# Patient Record
Sex: Female | Born: 1940 | Race: Black or African American | Hispanic: No | Smoking: Never smoker
Health system: Southern US, Community
[De-identification: ages and names within clinical notes are randomized; demographics above are authoritative.]

## PROBLEM LIST (undated history)

## (undated) DIAGNOSIS — K589 Irritable bowel syndrome without diarrhea: Secondary | ICD-10-CM

## (undated) DIAGNOSIS — K635 Polyp of colon: Secondary | ICD-10-CM

## (undated) DIAGNOSIS — D649 Anemia, unspecified: Secondary | ICD-10-CM

## (undated) DIAGNOSIS — M1711 Unilateral primary osteoarthritis, right knee: Secondary | ICD-10-CM

## (undated) DIAGNOSIS — K5792 Diverticulitis of intestine, part unspecified, without perforation or abscess without bleeding: Secondary | ICD-10-CM

## (undated) DIAGNOSIS — K219 Gastro-esophageal reflux disease without esophagitis: Secondary | ICD-10-CM

## (undated) DIAGNOSIS — D721 Eosinophilia, unspecified: Secondary | ICD-10-CM

## (undated) DIAGNOSIS — M1990 Unspecified osteoarthritis, unspecified site: Secondary | ICD-10-CM

## (undated) DIAGNOSIS — K9049 Malabsorption due to intolerance, not elsewhere classified: Secondary | ICD-10-CM

## (undated) DIAGNOSIS — I10 Essential (primary) hypertension: Secondary | ICD-10-CM

## (undated) DIAGNOSIS — K5790 Diverticulosis of intestine, part unspecified, without perforation or abscess without bleeding: Secondary | ICD-10-CM

## (undated) DIAGNOSIS — Z79899 Other long term (current) drug therapy: Secondary | ICD-10-CM

## (undated) DIAGNOSIS — E782 Mixed hyperlipidemia: Secondary | ICD-10-CM

## (undated) DIAGNOSIS — E669 Obesity, unspecified: Secondary | ICD-10-CM

## (undated) DIAGNOSIS — M5030 Other cervical disc degeneration, unspecified cervical region: Secondary | ICD-10-CM

## (undated) HISTORY — PX: COLONOSCOPY: SHX174

## (undated) HISTORY — PX: JOINT REPLACEMENT: SHX530

## (undated) HISTORY — PX: COLON SURGERY: SHX602

## (undated) HISTORY — PX: POLYPECTOMY: SHX149

## (undated) HISTORY — PX: ESOPHAGOGASTRODUODENOSCOPY: SHX1529

## (undated) HISTORY — PX: BILATERAL CARPAL TUNNEL RELEASE: SHX6508

---

## 1973-08-31 HISTORY — PX: ORIF DISTAL RADIUS FRACTURE: SUR927

## 1974-08-08 HISTORY — PX: HARDWARE REMOVAL: SHX979

## 1990-03-02 HISTORY — PX: TOTAL HIP ARTHROPLASTY: SHX124

## 1990-03-02 HISTORY — PX: HEMICOLECTOMY: SHX854

## 1995-03-03 HISTORY — PX: REVISION TOTAL HIP ARTHROPLASTY: SHX766

## 2001-03-02 HISTORY — PX: TOTAL HIP ARTHROPLASTY: SHX124

## 2004-01-01 ENCOUNTER — Ambulatory Visit: Payer: Self-pay | Admitting: Urology

## 2004-11-13 ENCOUNTER — Ambulatory Visit: Payer: Self-pay | Admitting: Internal Medicine

## 2005-09-24 ENCOUNTER — Other Ambulatory Visit: Payer: Self-pay

## 2005-09-24 ENCOUNTER — Emergency Department: Payer: Self-pay | Admitting: Emergency Medicine

## 2005-11-26 ENCOUNTER — Ambulatory Visit: Payer: Self-pay | Admitting: Internal Medicine

## 2006-04-15 ENCOUNTER — Ambulatory Visit: Payer: Self-pay | Admitting: Unknown Physician Specialty

## 2006-06-01 ENCOUNTER — Other Ambulatory Visit: Payer: Self-pay

## 2006-06-01 ENCOUNTER — Ambulatory Visit: Payer: Self-pay | Admitting: Unknown Physician Specialty

## 2006-06-04 ENCOUNTER — Ambulatory Visit: Payer: Self-pay | Admitting: Unknown Physician Specialty

## 2006-11-29 ENCOUNTER — Ambulatory Visit: Payer: Self-pay | Admitting: Internal Medicine

## 2007-03-03 HISTORY — PX: UPPER GI ENDOSCOPY: SHX6162

## 2007-05-23 ENCOUNTER — Ambulatory Visit: Payer: Self-pay | Admitting: Gastroenterology

## 2007-05-23 HISTORY — PX: ESOPHAGOGASTRODUODENOSCOPY: SHX1529

## 2007-08-04 ENCOUNTER — Ambulatory Visit: Payer: Self-pay | Admitting: Unknown Physician Specialty

## 2007-08-04 ENCOUNTER — Other Ambulatory Visit: Payer: Self-pay

## 2007-08-15 ENCOUNTER — Ambulatory Visit: Payer: Self-pay | Admitting: Unknown Physician Specialty

## 2007-12-13 ENCOUNTER — Ambulatory Visit: Payer: Self-pay | Admitting: Internal Medicine

## 2008-08-06 ENCOUNTER — Ambulatory Visit: Payer: Self-pay | Admitting: Unknown Physician Specialty

## 2009-01-23 ENCOUNTER — Ambulatory Visit: Payer: Self-pay | Admitting: Internal Medicine

## 2010-01-30 ENCOUNTER — Ambulatory Visit: Payer: Self-pay | Admitting: Internal Medicine

## 2010-03-08 ENCOUNTER — Ambulatory Visit: Payer: Self-pay | Admitting: Internal Medicine

## 2010-07-17 ENCOUNTER — Ambulatory Visit: Payer: Self-pay | Admitting: Internal Medicine

## 2011-02-02 ENCOUNTER — Ambulatory Visit: Payer: Self-pay | Admitting: Internal Medicine

## 2011-03-21 ENCOUNTER — Ambulatory Visit: Payer: Self-pay

## 2011-05-21 ENCOUNTER — Ambulatory Visit: Payer: Self-pay | Admitting: Internal Medicine

## 2012-02-03 ENCOUNTER — Ambulatory Visit: Payer: Self-pay | Admitting: Internal Medicine

## 2012-08-13 ENCOUNTER — Ambulatory Visit: Payer: Self-pay | Admitting: Family Medicine

## 2012-08-13 LAB — SEDIMENTATION RATE: Erythrocyte Sed Rate: 4 mm/h

## 2012-08-13 LAB — COMPREHENSIVE METABOLIC PANEL WITH GFR
Albumin: 4.1 g/dL
Alkaline Phosphatase: 95 U/L
Anion Gap: 6 — ABNORMAL LOW
BUN: 8 mg/dL
Bilirubin,Total: 0.8 mg/dL
Calcium, Total: 9.3 mg/dL
Chloride: 104 mmol/L
Co2: 32 mmol/L
Creatinine: 0.63 mg/dL
EGFR (African American): >60
EGFR (Non-African Amer.): >60
Glucose: 87 mg/dL
Osmolality: 281
Potassium: 3.4 mmol/L — ABNORMAL LOW
SGOT(AST): 20 U/L
SGPT (ALT): 27 U/L
Sodium: 142 mmol/L
Total Protein: 7.7 g/dL

## 2012-08-13 LAB — CBC WITH DIFFERENTIAL/PLATELET
Basophil #: 0.1 10*3/uL (ref 0.0–0.1)
Basophil %: 1.1 %
HGB: 12.6 g/dL (ref 12.0–16.0)
Lymphocyte #: 2.7 10*3/uL (ref 1.0–3.6)
Lymphocyte %: 36.7 %
MCH: 22.8 pg — ABNORMAL LOW (ref 26.0–34.0)
Monocyte %: 9.1 %
RDW: 15 % — ABNORMAL HIGH (ref 11.5–14.5)
WBC: 7.3 10*3/uL (ref 3.6–11.0)

## 2012-09-01 ENCOUNTER — Ambulatory Visit: Payer: Self-pay | Admitting: Internal Medicine

## 2013-02-07 ENCOUNTER — Ambulatory Visit: Payer: Self-pay | Admitting: Internal Medicine

## 2013-09-12 ENCOUNTER — Ambulatory Visit: Payer: Self-pay | Admitting: Gastroenterology

## 2013-12-31 DIAGNOSIS — M5030 Other cervical disc degeneration, unspecified cervical region: Secondary | ICD-10-CM

## 2013-12-31 HISTORY — DX: Other cervical disc degeneration, unspecified cervical region: M50.30

## 2014-01-01 DIAGNOSIS — M5030 Other cervical disc degeneration, unspecified cervical region: Secondary | ICD-10-CM | POA: Insufficient documentation

## 2014-01-01 DIAGNOSIS — K219 Gastro-esophageal reflux disease without esophagitis: Secondary | ICD-10-CM | POA: Insufficient documentation

## 2014-01-01 DIAGNOSIS — I10 Essential (primary) hypertension: Secondary | ICD-10-CM | POA: Insufficient documentation

## 2014-01-01 DIAGNOSIS — R898 Other abnormal findings in specimens from other organs, systems and tissues: Secondary | ICD-10-CM | POA: Insufficient documentation

## 2014-01-01 DIAGNOSIS — E782 Mixed hyperlipidemia: Secondary | ICD-10-CM | POA: Insufficient documentation

## 2014-01-01 DIAGNOSIS — J302 Other seasonal allergic rhinitis: Secondary | ICD-10-CM

## 2014-01-01 DIAGNOSIS — D721 Eosinophilia: Secondary | ICD-10-CM

## 2014-01-01 DIAGNOSIS — M1990 Unspecified osteoarthritis, unspecified site: Secondary | ICD-10-CM

## 2014-01-01 HISTORY — DX: Gastro-esophageal reflux disease without esophagitis: K21.9

## 2014-01-01 HISTORY — DX: Other seasonal allergic rhinitis: J30.2

## 2014-01-01 HISTORY — DX: Unspecified osteoarthritis, unspecified site: M19.90

## 2014-02-08 ENCOUNTER — Ambulatory Visit: Payer: Self-pay | Admitting: Internal Medicine

## 2014-07-02 DIAGNOSIS — I10 Essential (primary) hypertension: Secondary | ICD-10-CM | POA: Diagnosis not present

## 2014-07-02 DIAGNOSIS — E782 Mixed hyperlipidemia: Secondary | ICD-10-CM | POA: Diagnosis not present

## 2014-07-02 DIAGNOSIS — D721 Eosinophilia: Secondary | ICD-10-CM | POA: Diagnosis not present

## 2014-07-02 DIAGNOSIS — K219 Gastro-esophageal reflux disease without esophagitis: Secondary | ICD-10-CM | POA: Diagnosis not present

## 2014-08-15 DIAGNOSIS — H5210 Myopia, unspecified eye: Secondary | ICD-10-CM | POA: Diagnosis not present

## 2014-08-15 DIAGNOSIS — H5203 Hypermetropia, bilateral: Secondary | ICD-10-CM | POA: Diagnosis not present

## 2014-08-28 DIAGNOSIS — H2513 Age-related nuclear cataract, bilateral: Secondary | ICD-10-CM | POA: Diagnosis not present

## 2014-10-05 DIAGNOSIS — M47816 Spondylosis without myelopathy or radiculopathy, lumbar region: Secondary | ICD-10-CM | POA: Diagnosis not present

## 2014-10-05 DIAGNOSIS — M5416 Radiculopathy, lumbar region: Secondary | ICD-10-CM | POA: Diagnosis not present

## 2014-10-05 DIAGNOSIS — I10 Essential (primary) hypertension: Secondary | ICD-10-CM | POA: Diagnosis not present

## 2014-10-05 DIAGNOSIS — M4316 Spondylolisthesis, lumbar region: Secondary | ICD-10-CM | POA: Diagnosis not present

## 2015-01-02 DIAGNOSIS — D721 Eosinophilia: Secondary | ICD-10-CM | POA: Diagnosis not present

## 2015-01-02 DIAGNOSIS — K219 Gastro-esophageal reflux disease without esophagitis: Secondary | ICD-10-CM | POA: Diagnosis not present

## 2015-01-02 DIAGNOSIS — Z79899 Other long term (current) drug therapy: Secondary | ICD-10-CM | POA: Diagnosis not present

## 2015-01-02 DIAGNOSIS — I10 Essential (primary) hypertension: Secondary | ICD-10-CM | POA: Diagnosis not present

## 2015-01-02 DIAGNOSIS — H9313 Tinnitus, bilateral: Secondary | ICD-10-CM | POA: Insufficient documentation

## 2015-01-02 DIAGNOSIS — Z23 Encounter for immunization: Secondary | ICD-10-CM | POA: Diagnosis not present

## 2015-01-02 DIAGNOSIS — E782 Mixed hyperlipidemia: Secondary | ICD-10-CM | POA: Diagnosis not present

## 2015-01-02 DIAGNOSIS — J302 Other seasonal allergic rhinitis: Secondary | ICD-10-CM | POA: Diagnosis not present

## 2015-06-19 DIAGNOSIS — J301 Allergic rhinitis due to pollen: Secondary | ICD-10-CM | POA: Diagnosis not present

## 2015-06-19 DIAGNOSIS — R0981 Nasal congestion: Secondary | ICD-10-CM | POA: Diagnosis not present

## 2015-06-25 DIAGNOSIS — J301 Allergic rhinitis due to pollen: Secondary | ICD-10-CM | POA: Diagnosis not present

## 2015-07-04 DIAGNOSIS — Z780 Asymptomatic menopausal state: Secondary | ICD-10-CM | POA: Diagnosis not present

## 2015-07-04 DIAGNOSIS — K589 Irritable bowel syndrome without diarrhea: Secondary | ICD-10-CM | POA: Diagnosis not present

## 2015-07-04 DIAGNOSIS — D721 Eosinophilia: Secondary | ICD-10-CM | POA: Diagnosis not present

## 2015-07-04 DIAGNOSIS — I10 Essential (primary) hypertension: Secondary | ICD-10-CM | POA: Diagnosis not present

## 2015-07-04 DIAGNOSIS — E782 Mixed hyperlipidemia: Secondary | ICD-10-CM | POA: Diagnosis not present

## 2015-07-04 DIAGNOSIS — H9313 Tinnitus, bilateral: Secondary | ICD-10-CM | POA: Diagnosis not present

## 2015-07-04 DIAGNOSIS — K219 Gastro-esophageal reflux disease without esophagitis: Secondary | ICD-10-CM | POA: Diagnosis not present

## 2015-07-04 DIAGNOSIS — J302 Other seasonal allergic rhinitis: Secondary | ICD-10-CM | POA: Diagnosis not present

## 2015-07-12 DIAGNOSIS — Z780 Asymptomatic menopausal state: Secondary | ICD-10-CM | POA: Diagnosis not present

## 2015-07-24 DIAGNOSIS — R1032 Left lower quadrant pain: Secondary | ICD-10-CM | POA: Diagnosis not present

## 2015-07-24 DIAGNOSIS — R509 Fever, unspecified: Secondary | ICD-10-CM | POA: Diagnosis not present

## 2015-07-24 DIAGNOSIS — R1031 Right lower quadrant pain: Secondary | ICD-10-CM | POA: Diagnosis not present

## 2015-07-24 DIAGNOSIS — R350 Frequency of micturition: Secondary | ICD-10-CM | POA: Diagnosis not present

## 2015-08-21 DIAGNOSIS — N760 Acute vaginitis: Secondary | ICD-10-CM | POA: Diagnosis not present

## 2015-08-21 DIAGNOSIS — R300 Dysuria: Secondary | ICD-10-CM | POA: Diagnosis not present

## 2015-08-28 DIAGNOSIS — H1045 Other chronic allergic conjunctivitis: Secondary | ICD-10-CM | POA: Diagnosis not present

## 2015-08-28 DIAGNOSIS — H2513 Age-related nuclear cataract, bilateral: Secondary | ICD-10-CM | POA: Diagnosis not present

## 2015-09-17 DIAGNOSIS — M654 Radial styloid tenosynovitis [de Quervain]: Secondary | ICD-10-CM | POA: Diagnosis not present

## 2015-09-18 DIAGNOSIS — M654 Radial styloid tenosynovitis [de Quervain]: Secondary | ICD-10-CM | POA: Diagnosis not present

## 2015-12-17 DIAGNOSIS — M654 Radial styloid tenosynovitis [de Quervain]: Secondary | ICD-10-CM | POA: Diagnosis not present

## 2015-12-20 ENCOUNTER — Emergency Department: Payer: Commercial Managed Care - HMO

## 2015-12-20 ENCOUNTER — Emergency Department
Admission: EM | Admit: 2015-12-20 | Discharge: 2015-12-20 | Disposition: A | Discharge status: Home / Self Care | Payer: Commercial Managed Care - HMO | Attending: Emergency Medicine | Admitting: Emergency Medicine

## 2015-12-20 ENCOUNTER — Encounter: Payer: Self-pay | Admitting: Emergency Medicine

## 2015-12-20 DIAGNOSIS — R0789 Other chest pain: Secondary | ICD-10-CM | POA: Diagnosis not present

## 2015-12-20 DIAGNOSIS — I10 Essential (primary) hypertension: Secondary | ICD-10-CM | POA: Insufficient documentation

## 2015-12-20 DIAGNOSIS — K219 Gastro-esophageal reflux disease without esophagitis: Secondary | ICD-10-CM

## 2015-12-20 DIAGNOSIS — R079 Chest pain, unspecified: Secondary | ICD-10-CM | POA: Diagnosis not present

## 2015-12-20 HISTORY — DX: Essential (primary) hypertension: I10

## 2015-12-20 LAB — CBC
HCT: 41.1 % (ref 35.0–47.0)
Hemoglobin: 13.1 g/dL (ref 12.0–16.0)
MCH: 23.4 pg — AB (ref 26.0–34.0)
MCHC: 31.7 g/dL — AB (ref 32.0–36.0)
MCV: 73.8 fL — ABNORMAL LOW (ref 80.0–100.0)
PLATELETS: 179 10*3/uL (ref 150–440)
RBC: 5.57 MIL/uL — ABNORMAL HIGH (ref 3.80–5.20)
RDW: 15.1 % — AB (ref 11.5–14.5)
WBC: 6.8 10*3/uL (ref 3.6–11.0)

## 2015-12-20 LAB — BASIC METABOLIC PANEL
Anion gap: 10 (ref 5–15)
BUN: 8 mg/dL (ref 6–20)
CALCIUM: 9.6 mg/dL (ref 8.9–10.3)
CO2: 27 mmol/L (ref 22–32)
CREATININE: 0.64 mg/dL (ref 0.44–1.00)
Chloride: 103 mmol/L (ref 101–111)
GFR calc non Af Amer: >60 mL/min (ref >60)
GLUCOSE: 114 mg/dL — AB (ref 65–99)
Potassium: 3.4 mmol/L — ABNORMAL LOW (ref 3.5–5.1)
Sodium: 140 mmol/L (ref 135–145)

## 2015-12-20 LAB — TROPONIN I

## 2015-12-20 NOTE — ED Provider Notes (Signed)
[facility] Emergency Department Provider Note ____________________________________________   I have reviewed the triage vital signs and the triage nursing note.  HISTORY  Chief Complaint Anxiety and Chest Pain   Historian Patient  HPI Roberta Hanson is a 75 y.o. female here with her daughter, for evaluation of epigastric discomfort forabout 2 days now. She states that her symptoms started after she started taking meloxicam which was prescribed for right wrist pain that she thinks was due to overuse as she has a history of couple tunnel. She came in because of the discomfort in her epigastrium and lower chest where she felt like she was having gas rumbling. She's not sure if her heart was skipping beats, but she felt movement in the lower part of her chest near her stomach which she thought was probably gas pains.  No nausea or sweats. No exertional discomfort. No known history of cardiac disease. No coughing or trouble breathing. Symptoms are most resolved right now. She reports having a stress test in the past, but many years ago at this point.    Past Medical History:  Diagnosis Date  . Hypertension     There are no active problems to display for this patient.   History reviewed. No pertinent surgical history.  Prior to Admission medications   Not on File    No Known Allergies  History reviewed. No pertinent family history.  Social History Social History  Substance Use Topics  . Smoking status: Never Smoker  . Smokeless tobacco: Never Used  . Alcohol use No    Review of Systems  Constitutional: Negative for fever. Eyes: Negative for visual changes. ENT: Negative for sore throat. Cardiovascular: Negative for Pleuritic chest pain. Respiratory: Negative for shortness of breath. Gastrointestinal: Negative for vomiting and diarrhea. Genitourinary: Negative for dysuria. Musculoskeletal: Negative for back pain. Skin: Negative for  rash. Neurological: Negative for headache. 10 point Review of Systems otherwise negative ____________________________________________   PHYSICAL EXAM:  VITAL SIGNS: ED Triage Vitals  Enc Vitals Group     BP 12/20/15 0919 (!) 151/64     Pulse Rate 12/20/15 0919 75     Resp 12/20/15 0919 18     Temp 12/20/15 0919 97.8 F (36.6 C)     Temp Source 12/20/15 0919 Oral     SpO2 12/20/15 0919 100 %     Weight 12/20/15 0915 200 lb (90.7 kg)     Height 12/20/15 0915 5\' 3"  (1.6 m)     Head Circumference --      Peak Flow --      Pain Score 12/20/15 0915 7     Pain Loc --      Pain Edu? --      Excl. in [place]? --      Constitutional: Alert and oriented. Well appearing and in no distress. HEENT   Head: Normocephalic and atraumatic.      Eyes: Conjunctivae are normal. PERRL. Normal extraocular movements.      Ears:         Nose: No congestion/rhinnorhea.   Mouth/Throat: Mucous membranes are moist.   Neck: No stridor. Cardiovascular/Chest: Normal rate, regular rhythm.  No murmurs, rubs, or gallops. Respiratory: Normal respiratory effort without tachypnea nor retractions. Breath sounds are clear and equal bilaterally. No wheezes/rales/rhonchi. Gastrointestinal: Soft. No distention, no guarding, no rebound. Nontender.  Obese  Genitourinary/rectal:Deferred Musculoskeletal: Nontender with normal range of motion in all extremities. No joint effusions.  No lower extremity tenderness.  No edema.  Patient reporting some discomfort at the right wrist, no obvious effusion or deformity, redness or skin rash. Neurologic:  Normal speech and language. No gross or focal neurologic deficits are appreciated. Skin:  Skin is warm, dry and intact. No rash noted. Psychiatric: Mood and affect are normal. Speech and behavior are normal. Patient exhibits appropriate insight and judgment.   ____________________________________________  LABS (pertinent positives/negatives)  Labs Reviewed  BASIC  METABOLIC PANEL - Abnormal; Notable for the following:       Result Value   Potassium 3.4 (*)    Glucose, Bld 114 (*)    All other components within normal limits  CBC - Abnormal; Notable for the following:    RBC 5.57 (*)    MCV 73.8 (*)    MCH 23.4 (*)    MCHC 31.7 (*)    RDW 15.1 (*)    All other components within normal limits  TROPONIN I    ____________________________________________    EKG I, Lisa Roca, MD, the attending physician have personally viewed and interpreted all ECGs.  76 bpm. Normal sinus rhythm. Left bundle branch block. Left axis deviation. Next  Left bundle branch is new compared to prior EKGs ____________________________________________  RADIOLOGY All Xrays were viewed by me. Imaging interpreted by Radiologist.  Chest x-ray two-view:  IMPRESSION: No active cardiopulmonary disease.  No evidence of pneumonia. __________________________________________  PROCEDURES  Procedure(s) performed: None  Critical Care performed: None  ____________________________________________   ED COURSE / ASSESSMENT AND PLAN  Pertinent labs & imaging results that were available during my care of the patient were reviewed by me and considered in my medical decision making (see chart for details).  Ms. Strande came here due to the lower chest/epigastric discomfort that clinically sounds like it is related to indigestion potentially made worse after she started meloxicam for her wrist. Symptoms do not sound likely to be due to ACS, and she is having no symptoms now. Her EKG does show new left bundle-branch block, but she is not having symptoms of an active heart attack. Her symptoms were ongoing over 24 hours, and her troponin is negative.  I have asked her to go ahead and follow up with a cardiologist given the new EKG findings, and she states she will probably need a referral from her primary care doctor to see a specialist.  I have asked her to directly call the  cardiologist office to ask for help in close follow up.  In terms of her inflammation of her wrist since she can't really take meloxicam, we did discuss trying an elimination diet for anti-inflammatory effect.  We discussed return precautions at length, and she feels comfortable discharging home today.  CONSULTATIONS:  None   Patient / Family / Caregiver informed of clinical course, medical decision-making process, and agree with plan.   I discussed return precautions, follow-up instructions, and discharge instructions with patient and/or family.   ___________________________________________   FINAL CLINICAL IMPRESSION(S) / ED DIAGNOSES   Final diagnoses:  Atypical chest pain  Gastroesophageal reflux disease, esophagitis presence not specified              Note: This dictation was prepared with Dragon dictation. Any transcriptional errors that result from this process are unintentional    Lisa Roca, MD 12/20/15 1414

## 2015-12-20 NOTE — ED Notes (Signed)
MD at bedside. 

## 2015-12-20 NOTE — ED Notes (Addendum)
Pt reports having chills that started yesterday. Pt states she was out walking yesterday and c/o ears ringing.  Pt states she used some saline nasal spray which helped with her sinus pain in her head.  Pt states last night she was feeling anxiety in her chest and heart palpitations yesterday. Pt states she has had anxiety and panic attacks in the past but nothing like this.  Pt states she started taking meloxicam a few days ago and had an upset stomach with bad gas.

## 2015-12-20 NOTE — Discharge Instructions (Signed)
You were evaluated for upper abdominal discomfort, and as we discussed your exam and evaluation are reassuring in the emergency department today. Please avoid meloxicam if you can as I suspect this may be causing the indigestion symptoms.  We discussed, your EKG shows some new findings, but the rest of your exam and evaluation are reassuring. Because of this I want you to follow-up with a cardiologist, please call the office today or Monday for an appointment on Monday or Tuesday.  Return to the emergency room for any worsening condition including chest pain, sweating, shortness of breath, heart racing, dizziness or passing out, weakness, numbness, or any other symptoms concerning to you.  We discussed trying an "Elimination Diet" -- you may look up information about this by searching "Whole 30"  We discussed, I would recommend glucosamine and chondroitin for joint health.

## 2015-12-20 NOTE — ED Triage Notes (Signed)
Pt to ed with c/o chest pain and "anxiousness"  Per pt that started yesterday.  Pt states sob, and lethargy associated with pain.  Also reports "I feel gassy"

## 2015-12-23 DIAGNOSIS — I447 Left bundle-branch block, unspecified: Secondary | ICD-10-CM

## 2015-12-23 DIAGNOSIS — I10 Essential (primary) hypertension: Secondary | ICD-10-CM | POA: Diagnosis not present

## 2015-12-23 DIAGNOSIS — E782 Mixed hyperlipidemia: Secondary | ICD-10-CM | POA: Diagnosis not present

## 2015-12-23 DIAGNOSIS — K219 Gastro-esophageal reflux disease without esophagitis: Secondary | ICD-10-CM | POA: Diagnosis not present

## 2015-12-23 HISTORY — DX: Left bundle-branch block, unspecified: I44.7

## 2016-01-08 ENCOUNTER — Other Ambulatory Visit: Payer: Self-pay | Admitting: Internal Medicine

## 2016-01-08 DIAGNOSIS — Z1231 Encounter for screening mammogram for malignant neoplasm of breast: Secondary | ICD-10-CM

## 2016-01-08 DIAGNOSIS — K589 Irritable bowel syndrome without diarrhea: Secondary | ICD-10-CM | POA: Diagnosis not present

## 2016-01-08 DIAGNOSIS — Z79899 Other long term (current) drug therapy: Secondary | ICD-10-CM | POA: Insufficient documentation

## 2016-01-08 DIAGNOSIS — D721 Eosinophilia: Secondary | ICD-10-CM | POA: Diagnosis not present

## 2016-01-08 DIAGNOSIS — J302 Other seasonal allergic rhinitis: Secondary | ICD-10-CM | POA: Diagnosis not present

## 2016-01-08 DIAGNOSIS — I10 Essential (primary) hypertension: Secondary | ICD-10-CM | POA: Diagnosis not present

## 2016-01-08 DIAGNOSIS — Z23 Encounter for immunization: Secondary | ICD-10-CM | POA: Diagnosis not present

## 2016-01-08 DIAGNOSIS — K219 Gastro-esophageal reflux disease without esophagitis: Secondary | ICD-10-CM | POA: Diagnosis not present

## 2016-01-08 DIAGNOSIS — R351 Nocturia: Secondary | ICD-10-CM | POA: Diagnosis not present

## 2016-01-08 DIAGNOSIS — E782 Mixed hyperlipidemia: Secondary | ICD-10-CM | POA: Diagnosis not present

## 2016-01-16 ENCOUNTER — Ambulatory Visit
Admission: RE | Admit: 2016-01-16 | Discharge: 2016-01-16 | Disposition: A | Discharge status: Home / Self Care | Payer: Commercial Managed Care - HMO | Source: Ambulatory Visit | Attending: Internal Medicine | Admitting: Internal Medicine

## 2016-01-16 DIAGNOSIS — Z1231 Encounter for screening mammogram for malignant neoplasm of breast: Secondary | ICD-10-CM | POA: Diagnosis not present

## 2016-12-23 DIAGNOSIS — M1711 Unilateral primary osteoarthritis, right knee: Secondary | ICD-10-CM | POA: Insufficient documentation

## 2016-12-23 DIAGNOSIS — E669 Obesity, unspecified: Secondary | ICD-10-CM | POA: Insufficient documentation

## 2017-06-09 ENCOUNTER — Other Ambulatory Visit: Payer: Self-pay | Admitting: Orthopedic Surgery

## 2017-06-09 DIAGNOSIS — Z96649 Presence of unspecified artificial hip joint: Principal | ICD-10-CM

## 2017-06-09 DIAGNOSIS — T84038A Mechanical loosening of other internal prosthetic joint, initial encounter: Secondary | ICD-10-CM

## 2017-06-15 ENCOUNTER — Encounter
Admission: RE | Admit: 2017-06-15 | Discharge: 2017-06-15 | Disposition: A | Discharge status: Home / Self Care | Payer: Medicare HMO | Source: Ambulatory Visit | Attending: Orthopedic Surgery | Admitting: Orthopedic Surgery

## 2017-06-15 DIAGNOSIS — T84038A Mechanical loosening of other internal prosthetic joint, initial encounter: Secondary | ICD-10-CM | POA: Diagnosis present

## 2017-06-15 DIAGNOSIS — Z96649 Presence of unspecified artificial hip joint: Secondary | ICD-10-CM | POA: Insufficient documentation

## 2017-06-15 DIAGNOSIS — R937 Abnormal findings on diagnostic imaging of other parts of musculoskeletal system: Secondary | ICD-10-CM | POA: Insufficient documentation

## 2017-06-15 DIAGNOSIS — X58XXXA Exposure to other specified factors, initial encounter: Secondary | ICD-10-CM | POA: Diagnosis not present

## 2017-06-15 MED ORDER — TECHNETIUM TC 99M MEDRONATE IV KIT
22.0700 | PACK | Freq: Once | INTRAVENOUS | Status: AC | PRN
  Administered 2017-06-15: 22.07 via INTRAVENOUS

## 2017-07-01 ENCOUNTER — Other Ambulatory Visit: Payer: Self-pay

## 2017-07-01 ENCOUNTER — Encounter
Admission: RE | Admit: 2017-07-01 | Discharge: 2017-07-01 | Disposition: A | Discharge status: Home / Self Care | Payer: Medicare HMO | Source: Ambulatory Visit | Attending: Orthopedic Surgery | Admitting: Orthopedic Surgery

## 2017-07-01 DIAGNOSIS — Z01810 Encounter for preprocedural cardiovascular examination: Secondary | ICD-10-CM | POA: Insufficient documentation

## 2017-07-01 DIAGNOSIS — Z01812 Encounter for preprocedural laboratory examination: Secondary | ICD-10-CM | POA: Insufficient documentation

## 2017-07-01 DIAGNOSIS — I10 Essential (primary) hypertension: Secondary | ICD-10-CM | POA: Insufficient documentation

## 2017-07-01 DIAGNOSIS — I447 Left bundle-branch block, unspecified: Secondary | ICD-10-CM | POA: Diagnosis not present

## 2017-07-01 HISTORY — DX: Mixed hyperlipidemia: E78.2

## 2017-07-01 HISTORY — DX: Anemia, unspecified: D64.9

## 2017-07-01 HISTORY — DX: Unspecified osteoarthritis, unspecified site: M19.90

## 2017-07-01 HISTORY — DX: Diverticulitis of intestine, part unspecified, without perforation or abscess without bleeding: K57.92

## 2017-07-01 HISTORY — DX: Gastro-esophageal reflux disease without esophagitis: K21.9

## 2017-07-01 HISTORY — DX: Polyp of colon: K63.5

## 2017-07-01 HISTORY — DX: Other cervical disc degeneration, unspecified cervical region: M50.30

## 2017-07-01 HISTORY — DX: Diverticulosis of intestine, part unspecified, without perforation or abscess without bleeding: K57.90

## 2017-07-01 HISTORY — DX: Irritable bowel syndrome, unspecified: K58.9

## 2017-07-01 LAB — CBC WITH DIFFERENTIAL/PLATELET
BASOS ABS: 0.1 10*3/uL (ref 0–0.1)
Basophils Relative: 1 %
EOS PCT: 2 %
Eosinophils Absolute: 0.1 10*3/uL (ref 0–0.7)
HEMATOCRIT: 39.7 % (ref 35.0–47.0)
HEMOGLOBIN: 12.7 g/dL (ref 12.0–16.0)
LYMPHS PCT: 29 %
Lymphs Abs: 1.9 10*3/uL (ref 1.0–3.6)
MCH: 23.6 pg — ABNORMAL LOW (ref 26.0–34.0)
MCHC: 32.1 g/dL (ref 32.0–36.0)
MCV: 73.7 fL — AB (ref 80.0–100.0)
Monocytes Absolute: 0.6 10*3/uL (ref 0.2–0.9)
Monocytes Relative: 10 %
NEUTROS ABS: 4 10*3/uL (ref 1.4–6.5)
NEUTROS PCT: 58 %
PLATELETS: 180 10*3/uL (ref 150–440)
RBC: 5.39 MIL/uL — ABNORMAL HIGH (ref 3.80–5.20)
RDW: 15 % — ABNORMAL HIGH (ref 11.5–14.5)
WBC: 6.7 10*3/uL (ref 3.6–11.0)

## 2017-07-01 LAB — COMPREHENSIVE METABOLIC PANEL
ALT: 16 U/L (ref 14–54)
AST: 22 U/L (ref 15–41)
Albumin: 4.1 g/dL (ref 3.5–5.0)
Alkaline Phosphatase: 80 U/L (ref 38–126)
Anion gap: 5 (ref 5–15)
BUN: 12 mg/dL (ref 6–20)
CHLORIDE: 102 mmol/L (ref 101–111)
CO2: 32 mmol/L (ref 22–32)
CREATININE: 0.43 mg/dL — AB (ref 0.44–1.00)
Calcium: 9.6 mg/dL (ref 8.9–10.3)
GFR calc Af Amer: >60 mL/min (ref >60)
Glucose, Bld: 104 mg/dL — ABNORMAL HIGH (ref 65–99)
Potassium: 3.3 mmol/L — ABNORMAL LOW (ref 3.5–5.1)
Sodium: 139 mmol/L (ref 135–145)
Total Bilirubin: 1 mg/dL (ref 0.3–1.2)
Total Protein: 7.3 g/dL (ref 6.5–8.1)

## 2017-07-01 LAB — URINALYSIS, ROUTINE W REFLEX MICROSCOPIC
BACTERIA UA: NONE SEEN
BILIRUBIN URINE: NEGATIVE
GLUCOSE, UA: NEGATIVE mg/dL
KETONES UR: NEGATIVE mg/dL
LEUKOCYTES UA: NEGATIVE
Nitrite: NEGATIVE
PH: 6 (ref 5.0–8.0)
Protein, ur: NEGATIVE mg/dL
Specific Gravity, Urine: 1.003 — ABNORMAL LOW (ref 1.005–1.030)
WBC, UA: NONE SEEN WBC/hpf (ref 0–5)

## 2017-07-01 LAB — SURGICAL PCR SCREEN
MRSA, PCR: NEGATIVE
Staphylococcus aureus: NEGATIVE

## 2017-07-01 LAB — PROTIME-INR
INR: 0.95
PROTHROMBIN TIME: 12.6 s (ref 11.4–15.2)

## 2017-07-01 LAB — C-REACTIVE PROTEIN: CRP: <0.8 mg/dL (ref <1.0)

## 2017-07-01 LAB — APTT: APTT: 31 s (ref 24–36)

## 2017-07-01 LAB — SEDIMENTATION RATE: Sed Rate: 5 mm/hr (ref 0–30)

## 2017-07-01 NOTE — Patient Instructions (Signed)
Your procedure is scheduled on: Wednesday, Jul 14, 2017 Report to Day Surgery on the 2nd floor of the [facility]. To find out your arrival time, please call [phone] between 1PM - 3PM on: Tuesday, Jul 13, 2017  REMEMBER: Instructions that are not followed completely may result in serious medical risk, up to and including death; or upon the discretion of your surgeon and anesthesiologist your surgery may need to be rescheduled.  Do not eat food after midnight the night before your procedure.  No gum chewing, lozengers or hard candies.  You may however, drink CLEAR liquids up to 2 hours before you are scheduled to arrive for your surgery. Do not drink anything within 2 hours of the start of your surgery.  Clear liquids include: - water  - apple juice without pulp - clear gatorade - black coffee or tea (Do NOT add anything to the coffee or tea) Do NOT drink anything that is not on this list.  No Alcohol for 24 hours before or after surgery.  No Smoking including e-cigarettes for 24 hours prior to surgery.  No chewable tobacco products for at least 6 hours prior to surgery.  No nicotine patches on the day of surgery.  On the morning of surgery brush your teeth with toothpaste and water, you may rinse your mouth with mouthwash if you wish. Do not swallow any toothpaste or mouthwash.  Notify your doctor if there is any change in your medical condition (cold, fever, infection).  Do not wear jewelry, make-up, hairpins, clips or nail polish.  Do not wear lotions, powders, or perfumes. You may wear deodorant.  Do not shave 48 hours prior to surgery.   Contacts and dentures may not be worn into surgery.  Do not bring valuables to the hospital, including drivers license, insurance or credit cards.  [facility] is not responsible for any belongings or valuables.   TAKE THESE MEDICATIONS THE MORNING OF SURGERY:  NONE  Use CHG Soap as directed on instruction sheet.  May 8 - Stop  Anti-inflammatories (NSAIDS) such as Advil, Aleve, Ibuprofen, Motrin, Naproxen, Naprosyn and Aspirin based products such as Excedrin, Goodys Powder, BC Powder. (May take Tylenol or Acetaminophen if needed.)  May 8 - Stop ANY OVER THE COUNTER supplements until after surgery.  (Garlic, Fish Oil) (May continue magnesium and multivitamin.)  Wear comfortable clothing (specific to your surgery type) to the hospital.   Plan for stool softeners for home use.  If you are being admitted to the hospital overnight, leave your suitcase in the car. After surgery it may be brought to your room.  If you are being discharged the day of surgery, you will not be allowed to drive home. You will need a responsible adult to drive you home and stay with you that night.   If you are taking public transportation, you will need to have a responsible adult with you. Please confirm with your physician that it is acceptable to use public transportation.   Please call [phone] if you have any questions about these instructions.

## 2017-07-01 NOTE — Pre-Procedure Instructions (Signed)
KNOWN LBBB 2017 EKG

## 2017-07-02 LAB — URINE CULTURE
Culture: NO GROWTH
SPECIAL REQUESTS: NORMAL

## 2017-07-13 MED ORDER — TRANEXAMIC ACID 1000 MG/10ML IV SOLN
1000.0000 mg | INTRAVENOUS | Status: AC
  Administered 2017-07-14: 1000 mg via INTRAVENOUS
  Filled 2017-07-13: qty 1100

## 2017-07-13 MED ORDER — CEFAZOLIN SODIUM-DEXTROSE 2-4 GM/100ML-% IV SOLN
2.0000 g | INTRAVENOUS | Status: AC
  Administered 2017-07-14: 2 g via INTRAVENOUS

## 2017-07-14 ENCOUNTER — Other Ambulatory Visit: Payer: Self-pay

## 2017-07-14 ENCOUNTER — Inpatient Hospital Stay: Payer: Medicare HMO | Admitting: Anesthesiology

## 2017-07-14 ENCOUNTER — Encounter
Admission: RE | Disposition: A | Discharge status: Home Health | Payer: Self-pay | Source: Ambulatory Visit | Attending: Orthopedic Surgery

## 2017-07-14 ENCOUNTER — Encounter: Payer: Self-pay | Admitting: Orthopedic Surgery

## 2017-07-14 ENCOUNTER — Inpatient Hospital Stay: Payer: Medicare HMO

## 2017-07-14 ENCOUNTER — Inpatient Hospital Stay
Admission: RE | Admit: 2017-07-14 | Discharge: 2017-07-16 | DRG: 468 | Disposition: A | Discharge status: Home Health | Payer: Medicare HMO | Source: Ambulatory Visit | Attending: Orthopedic Surgery | Admitting: Orthopedic Surgery

## 2017-07-14 DIAGNOSIS — E782 Mixed hyperlipidemia: Secondary | ICD-10-CM | POA: Diagnosis present

## 2017-07-14 DIAGNOSIS — T84039A Mechanical loosening of unspecified internal prosthetic joint, initial encounter: Secondary | ICD-10-CM | POA: Diagnosis present

## 2017-07-14 DIAGNOSIS — Z79899 Other long term (current) drug therapy: Secondary | ICD-10-CM

## 2017-07-14 DIAGNOSIS — K219 Gastro-esophageal reflux disease without esophagitis: Secondary | ICD-10-CM | POA: Diagnosis present

## 2017-07-14 DIAGNOSIS — Y792 Prosthetic and other implants, materials and accessory orthopedic devices associated with adverse incidents: Secondary | ICD-10-CM | POA: Diagnosis present

## 2017-07-14 DIAGNOSIS — Z96642 Presence of left artificial hip joint: Secondary | ICD-10-CM

## 2017-07-14 DIAGNOSIS — I10 Essential (primary) hypertension: Secondary | ICD-10-CM | POA: Diagnosis present

## 2017-07-14 DIAGNOSIS — Z96649 Presence of unspecified artificial hip joint: Secondary | ICD-10-CM

## 2017-07-14 HISTORY — PX: TOTAL HIP REVISION: SHX763

## 2017-07-14 LAB — POCT I-STAT 4, (NA,K, GLUC, HGB,HCT)
GLUCOSE: 96 mg/dL (ref 65–99)
HCT: 39 % (ref 36.0–46.0)
Hemoglobin: 13.3 g/dL (ref 12.0–15.0)
Potassium: 3.8 mmol/L (ref 3.5–5.1)
Sodium: 140 mmol/L (ref 135–145)

## 2017-07-14 LAB — ABO/RH: ABO/RH(D): O POS

## 2017-07-14 SURGERY — TOTAL HIP REVISION
Anesthesia: General | Laterality: Left

## 2017-07-14 MED ORDER — OXYCODONE HCL 5 MG PO TABS: 10.0000 mg | ORAL_TABLET | ORAL | Status: DC | PRN

## 2017-07-14 MED ORDER — HYDROCHLOROTHIAZIDE 12.5 MG PO CAPS
12.5000 mg | ORAL_CAPSULE | Freq: Every day | ORAL | Status: DC
  Administered 2017-07-15: 12.5 mg via ORAL
  Filled 2017-07-14: qty 1

## 2017-07-14 MED ORDER — FENTANYL CITRATE (PF) 250 MCG/5ML IJ SOLN
INTRAMUSCULAR | Status: AC
  Filled 2017-07-14: qty 5

## 2017-07-14 MED ORDER — ACETAMINOPHEN 325 MG PO TABS: 325.0000 mg | ORAL_TABLET | Freq: Four times a day (QID) | ORAL | Status: DC | PRN

## 2017-07-14 MED ORDER — HYDROMORPHONE HCL 1 MG/ML IJ SOLN: 0.5000 mg | INTRAMUSCULAR | Status: DC | PRN

## 2017-07-14 MED ORDER — ADULT MULTIVITAMIN W/MINERALS CH
1.0000 | ORAL_TABLET | Freq: Every day | ORAL | Status: DC
  Administered 2017-07-15 – 2017-07-16 (×2): 1 via ORAL
  Filled 2017-07-14 (×2): qty 1

## 2017-07-14 MED ORDER — FAMOTIDINE 20 MG PO TABS
20.0000 mg | ORAL_TABLET | Freq: Once | ORAL | Status: AC
  Administered 2017-07-14: 20 mg via ORAL

## 2017-07-14 MED ORDER — SUGAMMADEX SODIUM 200 MG/2ML IV SOLN
INTRAVENOUS | Status: AC
  Filled 2017-07-14: qty 2

## 2017-07-14 MED ORDER — GABAPENTIN 300 MG PO CAPS
ORAL_CAPSULE | ORAL | Status: AC
  Administered 2017-07-14: 300 mg via ORAL
  Filled 2017-07-14: qty 1

## 2017-07-14 MED ORDER — MAGNESIUM OXIDE 400 (241.3 MG) MG PO TABS
400.0000 mg | ORAL_TABLET | Freq: Every day | ORAL | Status: DC
  Administered 2017-07-15 – 2017-07-16 (×2): 400 mg via ORAL
  Filled 2017-07-14 (×2): qty 1

## 2017-07-14 MED ORDER — MAGNESIUM HYDROXIDE 400 MG/5ML PO SUSP: 30.0000 mL | Freq: Every day | ORAL | Status: DC | PRN

## 2017-07-14 MED ORDER — ONDANSETRON HCL 4 MG PO TABS: 4.0000 mg | ORAL_TABLET | Freq: Four times a day (QID) | ORAL | Status: DC | PRN

## 2017-07-14 MED ORDER — OXYCODONE HCL 5 MG/5ML PO SOLN: 5.0000 mg | Freq: Once | ORAL | Status: DC | PRN

## 2017-07-14 MED ORDER — PROPOFOL 10 MG/ML IV BOLUS
INTRAVENOUS | Status: AC
  Filled 2017-07-14: qty 20

## 2017-07-14 MED ORDER — FENTANYL CITRATE (PF) 100 MCG/2ML IJ SOLN
25.0000 ug | INTRAMUSCULAR | Status: DC | PRN
  Administered 2017-07-14 (×2): 25 ug via INTRAVENOUS

## 2017-07-14 MED ORDER — CELECOXIB 200 MG PO CAPS
400.0000 mg | ORAL_CAPSULE | Freq: Once | ORAL | Status: AC
  Administered 2017-07-14: 400 mg via ORAL
  Filled 2017-07-14: qty 2

## 2017-07-14 MED ORDER — ONDANSETRON HCL 4 MG/2ML IJ SOLN
INTRAMUSCULAR | Status: AC
  Filled 2017-07-14: qty 2

## 2017-07-14 MED ORDER — ACETAMINOPHEN 10 MG/ML IV SOLN
INTRAVENOUS | Status: AC
  Filled 2017-07-14: qty 100

## 2017-07-14 MED ORDER — NEOMYCIN-POLYMYXIN B GU 40-200000 IR SOLN
Status: DC | PRN
  Administered 2017-07-14: 14 mL

## 2017-07-14 MED ORDER — ROCURONIUM BROMIDE 50 MG/5ML IV SOLN
INTRAVENOUS | Status: AC
  Filled 2017-07-14: qty 2

## 2017-07-14 MED ORDER — OXYCODONE HCL 5 MG PO TABS: 5.0000 mg | ORAL_TABLET | Freq: Once | ORAL | Status: DC | PRN

## 2017-07-14 MED ORDER — TRANEXAMIC ACID 1000 MG/10ML IV SOLN
1000.0000 mg | Freq: Once | INTRAVENOUS | Status: AC
  Administered 2017-07-14: 1000 mg via INTRAVENOUS
  Filled 2017-07-14: qty 1100

## 2017-07-14 MED ORDER — PHENOL 1.4 % MT LIQD
1.0000 | OROMUCOSAL | Status: DC | PRN
  Filled 2017-07-14: qty 177

## 2017-07-14 MED ORDER — DIPHENHYDRAMINE HCL 12.5 MG/5ML PO ELIX: 12.5000 mg | ORAL_SOLUTION | ORAL | Status: DC | PRN

## 2017-07-14 MED ORDER — SENNOSIDES-DOCUSATE SODIUM 8.6-50 MG PO TABS
1.0000 | ORAL_TABLET | Freq: Two times a day (BID) | ORAL | Status: DC
  Administered 2017-07-14 – 2017-07-16 (×4): 1 via ORAL
  Filled 2017-07-14 (×4): qty 1

## 2017-07-14 MED ORDER — BISACODYL 10 MG RE SUPP
10.0000 mg | Freq: Every day | RECTAL | Status: DC | PRN
  Administered 2017-07-16: 10 mg via RECTAL
  Filled 2017-07-14: qty 1

## 2017-07-14 MED ORDER — ONDANSETRON HCL 4 MG/2ML IJ SOLN
INTRAMUSCULAR | Status: DC | PRN
  Administered 2017-07-14: 4 mg via INTRAVENOUS

## 2017-07-14 MED ORDER — FENTANYL CITRATE (PF) 100 MCG/2ML IJ SOLN
INTRAMUSCULAR | Status: AC
  Administered 2017-07-14: 25 ug via INTRAVENOUS
  Filled 2017-07-14: qty 2

## 2017-07-14 MED ORDER — LOSARTAN POTASSIUM 50 MG PO TABS
50.0000 mg | ORAL_TABLET | Freq: Every day | ORAL | Status: DC
  Administered 2017-07-15: 50 mg via ORAL
  Filled 2017-07-14: qty 1

## 2017-07-14 MED ORDER — GABAPENTIN 300 MG PO CAPS
300.0000 mg | ORAL_CAPSULE | Freq: Once | ORAL | Status: AC
  Administered 2017-07-14: 300 mg via ORAL

## 2017-07-14 MED ORDER — DEXAMETHASONE SODIUM PHOSPHATE 10 MG/ML IJ SOLN
INTRAMUSCULAR | Status: AC
  Administered 2017-07-14: 8 mg via INTRAVENOUS
  Filled 2017-07-14: qty 1

## 2017-07-14 MED ORDER — ENOXAPARIN SODIUM 30 MG/0.3ML ~~LOC~~ SOLN
30.0000 mg | Freq: Two times a day (BID) | SUBCUTANEOUS | Status: DC
  Administered 2017-07-15 – 2017-07-16 (×3): 30 mg via SUBCUTANEOUS
  Filled 2017-07-14 (×3): qty 0.3

## 2017-07-14 MED ORDER — FLEET ENEMA 7-19 GM/118ML RE ENEM: 1.0000 | ENEMA | Freq: Once | RECTAL | Status: DC | PRN

## 2017-07-14 MED ORDER — CHLORHEXIDINE GLUCONATE 4 % EX LIQD
60.0000 mL | Freq: Once | CUTANEOUS | Status: AC
  Administered 2017-07-14: 4 via TOPICAL

## 2017-07-14 MED ORDER — POTASSIUM CHLORIDE CRYS ER 10 MEQ PO TBCR
10.0000 meq | EXTENDED_RELEASE_TABLET | Freq: Every day | ORAL | Status: DC
Start: 2017-07-14 — End: 2017-07-16
  Administered 2017-07-15 – 2017-07-16 (×2): 10 meq via ORAL
  Filled 2017-07-14 (×2): qty 1

## 2017-07-14 MED ORDER — LIDOCAINE HCL (PF) 2 % IJ SOLN
INTRAMUSCULAR | Status: AC
  Filled 2017-07-14: qty 10

## 2017-07-14 MED ORDER — EPHEDRINE SULFATE 50 MG/ML IJ SOLN
INTRAMUSCULAR | Status: AC
  Filled 2017-07-14: qty 1

## 2017-07-14 MED ORDER — TRAMADOL HCL 50 MG PO TABS
50.0000 mg | ORAL_TABLET | ORAL | Status: DC | PRN
  Administered 2017-07-16: 50 mg via ORAL
  Filled 2017-07-14: qty 1

## 2017-07-14 MED ORDER — ALUM & MAG HYDROXIDE-SIMETH 200-200-20 MG/5ML PO SUSP: 30.0000 mL | ORAL | Status: DC | PRN

## 2017-07-14 MED ORDER — METOCLOPRAMIDE HCL 10 MG PO TABS
10.0000 mg | ORAL_TABLET | Freq: Three times a day (TID) | ORAL | Status: DC
  Administered 2017-07-14: 10 mg via ORAL
  Filled 2017-07-14 (×4): qty 1

## 2017-07-14 MED ORDER — LACTINEX PO CHEW
1.0000 | CHEWABLE_TABLET | Freq: Every day | ORAL | Status: DC
  Filled 2017-07-14 (×3): qty 1

## 2017-07-14 MED ORDER — SUGAMMADEX SODIUM 200 MG/2ML IV SOLN
INTRAVENOUS | Status: DC | PRN
  Administered 2017-07-14: 200 mg via INTRAVENOUS

## 2017-07-14 MED ORDER — CELECOXIB 200 MG PO CAPS
ORAL_CAPSULE | ORAL | Status: AC
  Administered 2017-07-14: 400 mg
  Filled 2017-07-14: qty 2

## 2017-07-14 MED ORDER — FERROUS SULFATE 325 (65 FE) MG PO TABS
325.0000 mg | ORAL_TABLET | Freq: Two times a day (BID) | ORAL | Status: DC
  Administered 2017-07-15 – 2017-07-16 (×3): 325 mg via ORAL
  Filled 2017-07-14 (×3): qty 1

## 2017-07-14 MED ORDER — FAMOTIDINE 20 MG PO TABS
ORAL_TABLET | ORAL | Status: AC
  Administered 2017-07-14: 20 mg via ORAL
  Filled 2017-07-14: qty 1

## 2017-07-14 MED ORDER — ONDANSETRON HCL 4 MG/2ML IJ SOLN
4.0000 mg | Freq: Four times a day (QID) | INTRAMUSCULAR | Status: DC | PRN
Start: 2017-07-14 — End: 2017-07-16

## 2017-07-14 MED ORDER — SODIUM CHLORIDE 0.9 % IV SOLN
INTRAVENOUS | Status: DC
  Administered 2017-07-14 – 2017-07-15 (×2): via INTRAVENOUS

## 2017-07-14 MED ORDER — FLUTICASONE PROPIONATE 50 MCG/ACT NA SUSP
1.0000 | Freq: Every day | NASAL | Status: DC | PRN
  Filled 2017-07-14: qty 16

## 2017-07-14 MED ORDER — ROCURONIUM BROMIDE 100 MG/10ML IV SOLN
INTRAVENOUS | Status: DC | PRN
  Administered 2017-07-14: 20 mg via INTRAVENOUS
  Administered 2017-07-14: 10 mg via INTRAVENOUS
  Administered 2017-07-14: 80 mg via INTRAVENOUS

## 2017-07-14 MED ORDER — MONTELUKAST SODIUM 10 MG PO TABS: 10.0000 mg | ORAL_TABLET | Freq: Every day | ORAL | Status: DC | PRN

## 2017-07-14 MED ORDER — DEXAMETHASONE SODIUM PHOSPHATE 10 MG/ML IJ SOLN
8.0000 mg | Freq: Once | INTRAMUSCULAR | Status: AC
  Administered 2017-07-14: 8 mg via INTRAVENOUS

## 2017-07-14 MED ORDER — FENTANYL CITRATE (PF) 100 MCG/2ML IJ SOLN
INTRAMUSCULAR | Status: DC | PRN
  Administered 2017-07-14: 25 ug via INTRAVENOUS
  Administered 2017-07-14: 100 ug via INTRAVENOUS
  Administered 2017-07-14: 150 ug via INTRAVENOUS
  Administered 2017-07-14: 100 ug via INTRAVENOUS

## 2017-07-14 MED ORDER — CEFAZOLIN SODIUM-DEXTROSE 2-4 GM/100ML-% IV SOLN
INTRAVENOUS | Status: AC
  Filled 2017-07-14: qty 100

## 2017-07-14 MED ORDER — EPHEDRINE SULFATE 50 MG/ML IJ SOLN
INTRAMUSCULAR | Status: DC | PRN
  Administered 2017-07-14: 10 mg via INTRAVENOUS

## 2017-07-14 MED ORDER — LACTATED RINGERS IV SOLN
INTRAVENOUS | Status: DC
  Administered 2017-07-14 (×2): via INTRAVENOUS

## 2017-07-14 MED ORDER — GABAPENTIN 300 MG PO CAPS
300.0000 mg | ORAL_CAPSULE | Freq: Every day | ORAL | Status: DC
  Administered 2017-07-14 – 2017-07-15 (×2): 300 mg via ORAL
  Filled 2017-07-14 (×2): qty 1

## 2017-07-14 MED ORDER — METOCLOPRAMIDE HCL 10 MG PO TABS
5.0000 mg | ORAL_TABLET | Freq: Three times a day (TID) | ORAL | Status: DC | PRN
  Administered 2017-07-15: 10 mg via ORAL

## 2017-07-14 MED ORDER — OXYCODONE HCL 5 MG PO TABS
5.0000 mg | ORAL_TABLET | ORAL | Status: DC | PRN
  Administered 2017-07-14 – 2017-07-15 (×4): 5 mg via ORAL
  Filled 2017-07-14 (×4): qty 1

## 2017-07-14 MED ORDER — ACETAMINOPHEN 10 MG/ML IV SOLN
1000.0000 mg | Freq: Four times a day (QID) | INTRAVENOUS | Status: AC
  Administered 2017-07-15 (×3): 1000 mg via INTRAVENOUS
  Filled 2017-07-14 (×4): qty 100

## 2017-07-14 MED ORDER — PANTOPRAZOLE SODIUM 40 MG PO TBEC
40.0000 mg | DELAYED_RELEASE_TABLET | Freq: Two times a day (BID) | ORAL | Status: DC
  Administered 2017-07-14 – 2017-07-16 (×4): 40 mg via ORAL
  Filled 2017-07-14 (×4): qty 1

## 2017-07-14 MED ORDER — PROPOFOL 10 MG/ML IV BOLUS
INTRAVENOUS | Status: DC | PRN
  Administered 2017-07-14: 1 mg via INTRAVENOUS
  Administered 2017-07-14: 150 mg via INTRAVENOUS

## 2017-07-14 MED ORDER — ACETAMINOPHEN 10 MG/ML IV SOLN
INTRAVENOUS | Status: DC | PRN
  Administered 2017-07-14: 1000 mg via INTRAVENOUS

## 2017-07-14 MED ORDER — METOCLOPRAMIDE HCL 5 MG/ML IJ SOLN: 5.0000 mg | Freq: Three times a day (TID) | INTRAMUSCULAR | Status: DC | PRN

## 2017-07-14 MED ORDER — CELECOXIB 200 MG PO CAPS
200.0000 mg | ORAL_CAPSULE | Freq: Two times a day (BID) | ORAL | Status: DC
  Administered 2017-07-14 – 2017-07-16 (×4): 200 mg via ORAL
  Filled 2017-07-14 (×4): qty 1

## 2017-07-14 MED ORDER — CEFAZOLIN SODIUM-DEXTROSE 2-4 GM/100ML-% IV SOLN
2.0000 g | Freq: Four times a day (QID) | INTRAVENOUS | Status: AC
  Administered 2017-07-14 – 2017-07-15 (×4): 2 g via INTRAVENOUS
  Filled 2017-07-14 (×4): qty 100

## 2017-07-14 MED ORDER — LIDOCAINE HCL (CARDIAC) PF 100 MG/5ML IV SOSY
PREFILLED_SYRINGE | INTRAVENOUS | Status: DC | PRN
  Administered 2017-07-14: 100 mg via INTRAVENOUS

## 2017-07-14 MED ORDER — OMEGA-3-ACID ETHYL ESTERS 1 G PO CAPS
1000.0000 mg | ORAL_CAPSULE | Freq: Every day | ORAL | Status: DC
  Administered 2017-07-15 – 2017-07-16 (×2): 1000 mg via ORAL
  Filled 2017-07-14 (×2): qty 1

## 2017-07-14 MED ORDER — MENTHOL 3 MG MT LOZG
1.0000 | LOZENGE | OROMUCOSAL | Status: DC | PRN
  Filled 2017-07-14: qty 9

## 2017-07-14 SURGICAL SUPPLY — 57 items
BALL HIP ML LG DEPUY (Hips) ×1 IMPLANT
BONE CANC CHIPS 20CC PCAN1/4 (Bone Implant) ×2 IMPLANT
CANISTER SUCT 1200ML W/VALVE (MISCELLANEOUS) ×2 IMPLANT
CANISTER SUCT 3000ML PPV (MISCELLANEOUS) ×4 IMPLANT
CHIPS CANC BONE 20CC PCAN1/4 (Bone Implant) ×1 IMPLANT
DRAPE INCISE IOBAN 66X60 STRL (DRAPES) ×2 IMPLANT
DRAPE SHEET LG 3/4 BI-LAMINATE (DRAPES) ×4 IMPLANT
DRAPE TABLE BACK 80X90 (DRAPES) ×2 IMPLANT
DRSG DERMACEA 8X12 NADH (GAUZE/BANDAGES/DRESSINGS) ×2 IMPLANT
DRSG OPSITE POSTOP 4X14 (GAUZE/BANDAGES/DRESSINGS) ×2 IMPLANT
DURAPREP 26ML APPLICATOR (WOUND CARE) ×4 IMPLANT
ELECT BLADE 6.5 EXT (BLADE) ×2 IMPLANT
ELECT CAUTERY BLADE 6.4 (BLADE) ×2 IMPLANT
GAUZE SPONGE 4X4 12PLY STRL (GAUZE/BANDAGES/DRESSINGS) ×2 IMPLANT
GLOVE BIOGEL M STRL SZ7.5 (GLOVE) ×4 IMPLANT
GLOVE BIOGEL PI IND STRL 9 (GLOVE) ×1 IMPLANT
GLOVE BIOGEL PI INDICATOR 9 (GLOVE) ×1
GLOVE INDICATOR 8.0 STRL GRN (GLOVE) ×2 IMPLANT
GLOVE SURG SYN 9.0  PF PI (GLOVE) ×1
GLOVE SURG SYN 9.0 PF PI (GLOVE) ×1 IMPLANT
GOWN STRL REUS W/ TWL LRG LVL3 (GOWN DISPOSABLE) ×2 IMPLANT
GOWN STRL REUS W/TWL 2XL LVL3 (GOWN DISPOSABLE) ×2 IMPLANT
GOWN STRL REUS W/TWL LRG LVL3 (GOWN DISPOSABLE) ×2
HANDPIECE VERSAJET DEBRIDEMENT (MISCELLANEOUS) IMPLANT
HEMOVAC 400CC 10FR (MISCELLANEOUS) ×2 IMPLANT
HIP BALLAML LG DEPUY (Hips) ×2 IMPLANT
HOOD PEEL AWAY FLYTE STAYCOOL (MISCELLANEOUS) ×4 IMPLANT
IRRIGATION STRYKERFLOW (MISCELLANEOUS) ×1 IMPLANT
IRRIGATOR STRYKERFLOW (MISCELLANEOUS) ×2
IV NS 100ML SINGLE PACK (IV SOLUTION) ×2 IMPLANT
LINER ACET 10D LIP 28X50 (Hips) ×2 IMPLANT
NDL SAFETY ECLIPSE 18X1.5 (NEEDLE) ×1 IMPLANT
NEEDLE FILTER BLUNT 18X 1/2SAF (NEEDLE) ×1
NEEDLE FILTER BLUNT 18X1 1/2 (NEEDLE) ×1 IMPLANT
NEEDLE HYPO 18GX1.5 SHARP (NEEDLE) ×1
NS IRRIG 1000ML POUR BTL (IV SOLUTION) ×2 IMPLANT
PACK HIP PROSTHESIS (MISCELLANEOUS) ×2 IMPLANT
PULSAVAC PLUS IRRIG FAN TIP (DISPOSABLE) ×2
RING LOCK ACET OD 50 (Hips) ×2 IMPLANT
SOL .9 NS 3000ML IRR  AL (IV SOLUTION) ×1
SOL .9 NS 3000ML IRR UROMATIC (IV SOLUTION) ×1 IMPLANT
STAPLER SKIN PROX 35W (STAPLE) ×2 IMPLANT
STRAP SAFETY 5IN WIDE (MISCELLANEOUS) ×2 IMPLANT
SUCTION FRAZIER HANDLE 10FR (MISCELLANEOUS) ×1
SUCTION TUBE FRAZIER 10FR DISP (MISCELLANEOUS) ×1 IMPLANT
SUT ETHIBOND #5 BRAIDED 30INL (SUTURE) ×2 IMPLANT
SUT VIC AB 0 CT1 36 (SUTURE) ×2 IMPLANT
SUT VIC AB 1 CT1 36 (SUTURE) ×4 IMPLANT
SUT VIC AB 2-0 CT1 27 (SUTURE) ×1
SUT VIC AB 2-0 CT1 TAPERPNT 27 (SUTURE) ×1 IMPLANT
SYR 20CC LL (SYRINGE) ×2 IMPLANT
TAPE ADH 3 LX (MISCELLANEOUS) ×2 IMPLANT
TAPE TRANSPORE STRL 2 31045 (GAUZE/BANDAGES/DRESSINGS) ×2 IMPLANT
TIP BRUSH PULSAVAC PLUS 24.33 (MISCELLANEOUS) IMPLANT
TIP FAN IRRIG PULSAVAC PLUS (DISPOSABLE) ×1 IMPLANT
TOWEL OR 17X26 4PK STRL BLUE (TOWEL DISPOSABLE) ×2 IMPLANT
TRAY FOLEY MTR SLVR 16FR STAT (SET/KITS/TRAYS/PACK) ×2 IMPLANT

## 2017-07-14 NOTE — Transfer of Care (Signed)
Immediate Anesthesia Transfer of Care Note  Patient: Roberta Hanson  Procedure(s) Performed: Procedure(s): TOTAL HIP REVISION (Left)  Patient Location: PACU  Anesthesia Type:General  Level of Consciousness: sedated  Airway & Oxygen Therapy: Patient Spontanous Breathing and Patient connected to face mask oxygen  Post-op Assessment: Report given to RN and Post -op Vital signs reviewed and stable  Post vital signs: Reviewed and stable  Last Vitals:  Vitals:   07/14/17 1000 07/14/17 1517  BP: 133/66 (!) 163/66  Pulse: 76   Resp: 16 16  Temp: 36.8 C (!) 36.2 C  SpO2: 12% 162%    Complications: No apparent anesthesia complications

## 2017-07-14 NOTE — Op Note (Signed)
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   ASSISTANT: Vance Peper, PA  ANESTHESIA: general  ESTIMATED BLOOD LOSS: 200 mL  FLUIDS REPLACED: 2000 mL of crystalloid  DRAINS: 2 medium Hemovac drains  IMPLANTS UTILIZED: Depuy Duraloc 50 mm dynamic locking ring, 28 mm cobalt chrome +8 mm femoral head (14/16 taper), 10 degree Duraloc Marathon polyethylene insert, cancellus bone graft  INDICATIONS FOR SURGERY: Roberta Hanson is a 77 y.o. year old female who underwent left total hip arthroplasty in 1982 and subsequent revision surgery as per Dr. Earnestine Leys.  She has recently noted some shifting and relative instability to the left hip.  Radiographs demonstrated significant polyethylene wear with periarticular osteolysis involving the proximal femur.  After discussion of the risks and benefits of surgical intervention, the patient expressed understanding of the risks benefits and agree with plans for surgical intervention.   PROCEDURE IN DETAIL: The patient was brought into the operating room and, after adequate general endotracheal anesthesia was achieved, patient was placed in a right lateral decubitus position.  Axillary roll was placed all bony prominences were well-padded.  The patient's left hip and leg were cleaned and prepped with alcohol and DuraPrep and draped in usual sterile fashion.  A "timeout" was performed as per usual protocol.  A lateral curvilinear incision was made in line with the previous surgical scar.  The IT band  was incised and the fibers of the gluteus maximus were split in line.  Dissection was continued posterior to the femur and the pseudocapsule was opened in a "T type" fashion.  The hip was then dislocated posteriorly.  A bone tamp was used to dislodge the 28 mm hip ball with the +8 neck length.  The trunnion was inspected and noted to be in good condition.  Dissection was carried out around the collar and shoulder of the prosthesis.  Significant osteolysis was noted extending along the proximal femur and greater trochanter.  The area was debrided using curettes and rongeurs.  The femoral implant was inspected and noted to be stable.  Attention was then directed to the acetabulum.  Fibrotic tissue was debrided from around the periphery of the acetabular component.  Curettes were used to further debride tissue from around the periphery.  A 6.5 mm cancellus screw was used to dislodge the polyethylene insert and locking ring.  The acetabular component was inspected and noted to be stable.  There is no apparent movement.  The apex hole eliminator was removed without difficulty.  No significant lysis is noted through the apex hole.  Wound was irrigated with normal saline with antibiotic solution.  A 10 degree polyethylene trial was placed with a high side at the 4 o'clock position.  A 20 mm hip ball trial with a +8 mm neck segment was placed on the trunnion and reduced.  Reasonably good stability was noted both anteriorly and posteriorly.  Trial components were removed.  A 50 mm Duraloc locking ring was carefully placed.  Next, the 10 degree Duraloc Marathon polyethylene implant was positioned with a high side at the 4 o'clock position and  impacted into place.  Cancellus bone had been reconstituted and this was packed along the contained cavity of the proximal femur.  The Morse taper was cleaned and dried.  A 28 mm cobalt chrome hip ball with a +8 mm neck segment was placed on the trunnion and impacted in place.  The hip  was reduced and placed range of motion.  Good equalization of limb length was appreciated and good stability was appreciated.  The wound was irrigated with copious amounts normal saline with MI solution using pulsatile lavage and suctioned dry.  Good hemostasis was appreciated.  The posterior capsulotomy was repaired using #5 Ethibond.  2 medium drains were placed one been brought through separate stab incision to be attached to a Hemovac reservoir.  The IT band was repaired using interrupted sutures of #1 Vicryl.  Subcutaneous tissue was approximated layers using first #0 Vicryl followed #2-0 Vicryl.  The skin was closed with skin staples.  The sterile dressing was applied.  The patient tolerated the procedure well.  She was transported to the recovery room in stable condition.  Sheliah Fiorillo P. Holley Bouche M.D.

## 2017-07-14 NOTE — Anesthesia Post-op Follow-up Note (Signed)
Anesthesia QCDR form completed.        

## 2017-07-14 NOTE — H&P (Signed)
The patient has been re-examined, and the chart reviewed, and there have been no interval changes to the documented history and physical.    The risks, benefits, and alternatives have been discussed at length. The patient expressed understanding of the risks benefits and agreed with plans for surgical intervention.  James P. Hooten, Jr. M.D.    

## 2017-07-14 NOTE — Anesthesia Procedure Notes (Signed)
Procedure Name: Intubation Date/Time: 07/14/2017 11:31 AM Performed by: Bernardo Heater, CRNA Pre-anesthesia Checklist: Patient identified, Emergency Drugs available, Suction available and Patient being monitored Patient Re-evaluated:Patient Re-evaluated prior to induction Oxygen Delivery Method: Circle system utilized Preoxygenation: Pre-oxygenation with 100% oxygen Induction Type: IV induction Laryngoscope Size: Mac and 3 Grade View: Grade II Tube size: 7.5 mm Number of attempts: 1 Airway Equipment and Method: Stylet Placement Confirmation: ETT inserted through vocal cords under direct vision,  positive ETCO2 and breath sounds checked- equal and bilateral Secured at: 21 cm Dental Injury: Teeth and Oropharynx as per pre-operative assessment

## 2017-07-14 NOTE — Discharge Instructions (Signed)
Instructions after Total Hip Replacement ° ° °  Elesa Garman P. Kregg Cihlar, Jr., M.D.    ° Dept. of Orthopaedics & Sports Medicine ° [facility] ° [address] ° [place], [place]  [address] ° Phone: [phone]   Fax: [phone] ° °  °DIET: °• Drink plenty of non-alcoholic fluids. °• Resume your normal diet. Include foods high in fiber. ° °ACTIVITY:  °• You may use crutches or a walker with weight-bearing as tolerated, unless instructed otherwise. °• You may be weaned off of the walker or crutches by your Physical Therapist.  °• Do NOT reach below the level of your knees or cross your legs until allowed.    °• Continue doing gentle exercises. Exercising will reduce the pain and swelling, increase motion, and prevent muscle weakness.   °• Please continue to use the TED compression stockings for 6 weeks. You may remove the stockings at night, but should reapply them in the morning. °• Do not drive or operate any equipment until instructed. ° °WOUND CARE:  °• Continue to use ice packs periodically to reduce pain and swelling. °• Keep the incision clean and dry. °• You may bathe or shower after the staples are removed at the first office visit following surgery. ° °MEDICATIONS: °• You may resume your regular medications. °• Please take the pain medication as prescribed on the medication. °• Do not take pain medication on an empty stomach. °• You have been given a prescription for a blood thinner to prevent blood clots. Please take the medication as instructed. (NOTE: After completing a 2 week course of Lovenox, take one Enteric-coated aspirin once a day.) °• Pain medications and iron supplements can cause constipation. Use a stool softener (Senokot or Colace) on a daily basis and a laxative (dulcolax or miralax) as needed. °• Do not drive or drink alcoholic beverages when taking pain medications. ° °CALL THE OFFICE FOR: °• Temperature above 101 degrees °• Excessive bleeding or drainage on the dressing. °• Excessive  swelling, coldness, or paleness of the toes. °• Persistent nausea and vomiting. ° °FOLLOW-UP:  °• You should have an appointment to return to the office in 6 weeks after surgery. °• Arrangements have been made for continuation of Physical Therapy (either home therapy or outpatient therapy). °  °

## 2017-07-14 NOTE — Anesthesia Preprocedure Evaluation (Signed)
Anesthesia Evaluation  Patient identified by MRN, date of birth, ID band Patient awake    Reviewed: Allergy & Precautions, H&P , NPO status , Patient's Chart, lab work & pertinent test results  History of Anesthesia Complications Negative for: history of anesthetic complications  Airway Mallampati: III  TM Distance: <3 FB Neck ROM: limited    Dental  (+) Chipped, Poor Dentition   Pulmonary neg pulmonary ROS, neg shortness of breath,           Cardiovascular Exercise Tolerance: Good hypertension, (-) angina(-) Past MI + dysrhythmias      Neuro/Psych negative neurological ROS  negative psych ROS   GI/Hepatic negative GI ROS, Neg liver ROS, GERD  Medicated and Controlled,  Endo/Other  negative endocrine ROS  Renal/GU      Musculoskeletal  (+) Arthritis ,   Abdominal   Peds  Hematology negative hematology ROS (+)   Anesthesia Other Findings Past Medical History: No date: Anemia No date: Colon polyp 12/2013: Degenerative disc disease, cervical No date: Diverticulitis No date: Diverticulosis 01/01/2014: GERD (gastroesophageal reflux disease) No date: Hyperlipidemia, mixed No date: Hypertension No date: Irritable bowel syndrome No date: Osteoarthritis  Past Surgical History: No date: BILATERAL CARPAL TUNNEL RELEASE     Comment:  right 06/04/2006 and left 08/15/2007 No date: COLONOSCOPY 08/08/1974: HARDWARE REMOVAL; Left     Comment:  removal of plate, left radius, bone graft, left ulna               from left ilium 1992: HEMICOLECTOMY; Left     Comment:  due to diverticulitis No date: JOINT REPLACEMENT 08/31/1973: ORIF DISTAL RADIUS FRACTURE; Left     Comment:  compression plate and small bone plate No date: POLYPECTOMY 1997: REVISION TOTAL HIP ARTHROPLASTY; Left 1992: TOTAL HIP ARTHROPLASTY; Left 2003: TOTAL HIP ARTHROPLASTY; Right 2009: UPPER GI ENDOSCOPY     Comment:  stretched esophagus     Reproductive/Obstetrics negative OB ROS                             Anesthesia Physical Anesthesia Plan  ASA: III  Anesthesia Plan: General ETT   Post-op Pain Management:    Induction: Intravenous  PONV Risk Score and Plan: Ondansetron, Dexamethasone, Midazolam and Treatment may vary due to age or medical condition  Airway Management Planned: Oral ETT  Additional Equipment:   Intra-op Plan:   Post-operative Plan: Extubation in OR  Informed Consent: I have reviewed the patients History and Physical, chart, labs and discussed the procedure including the risks, benefits and alternatives for the proposed anesthesia with the patient or authorized representative who has indicated his/her understanding and acceptance.   Dental Advisory Given  Plan Discussed with: Anesthesiologist, CRNA and Surgeon  Anesthesia Plan Comments: (Plan to intubate do to the length of the procedure   Patient consented for risks of anesthesia including but not limited to:  - adverse reactions to medications - damage to teeth, lips or other oral mucosa - sore throat or hoarseness - Damage to heart, brain, lungs or loss of life  Patient voiced understanding.)        Anesthesia Quick Evaluation

## 2017-07-14 NOTE — NC FL2 (Signed)
[place] LEVEL OF CARE SCREENING TOOL     IDENTIFICATION  Patient Name: Roberta Hanson Birthdate: January 03, 1941 Sex: female Admission Date (Current Location): 07/14/2017  [place] and [place] Number:  Engineering geologist and Address:  [facility], [address], [place], [place] [address]      Provider Number: [id number]  Attending Physician Name and Address:  Dereck Leep, MD  Relative Name and Phone Number:       Current Level of Care: Hospital Recommended Level of Care: [place] Prior Approval Number:    Date Approved/Denied:   PASRR Number: ([id number])  Discharge Plan: SNF    Current Diagnoses: Patient Active Problem List   Diagnosis Date Noted  . 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 Primary osteoarthritis of right knee 12/23/2016  . 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 Irritable bowel syndrome 07/04/2015  . Tinnitus, bilateral 01/02/2015  . Benign essential hypertension 01/01/2014  . Degenerative disc disease, cervical 01/01/2014  . Eosinophil count raised 01/01/2014  . GERD (gastroesophageal reflux disease) 01/01/2014  . Mixed hyperlipidemia 01/01/2014    Orientation RESPIRATION BLADDER Height & Weight     Self, Time, Situation, Place  Normal Continent Weight:   Height:     BEHAVIORAL SYMPTOMS/MOOD NEUROLOGICAL BOWEL NUTRITION STATUS      Continent Diet(Diet: Regular )  AMBULATORY STATUS COMMUNICATION OF NEEDS Skin   Extensive Assist Verbally Surgical wounds(Incision: Left Hip. )                       Personal Care Assistance Level of Assistance  Bathing, Feeding, Dressing Bathing Assistance: Limited assistance Feeding assistance: Independent Dressing Assistance: Limited assistance     Functional Limitations Info  Sight, Hearing, Speech Sight Info: Adequate Hearing Info: Adequate Speech Info: Adequate    SPECIAL CARE FACTORS FREQUENCY  PT (By licensed  PT), OT (By licensed OT)     PT Frequency: (5) OT Frequency: (5)            Contractures      Additional Factors Info  Code Status, Allergies Code Status Info: (Full Code. ) Allergies Info: (Sulfa Antibiotics, Lisinopril, Citalopram)           Current Medications (07/14/2017):  This is the current hospital active medication list Current Facility-Administered Medications  Medication Dose Route Frequency Provider Last Rate Last Dose  . 0.9 %  sodium chloride infusion   Intravenous Continuous Hooten, Laurice Record, MD      . acetaminophen (OFIRMEV) IV 1,000 mg  1,000 mg Intravenous Q6H Hooten, Laurice Record, MD      . Derrill Memo ON 07/15/2017] acetaminophen (TYLENOL) tablet 325-650 mg  325-650 mg Oral Q6H PRN Hooten, Laurice Record, MD      . alum & mag hydroxide-simeth (MAALOX/MYLANTA) 200-200-20 MG/5ML suspension 30 mL  30 mL Oral Q4H PRN Hooten, Laurice Record, MD      . bisacodyl (DULCOLAX) suppository 10 mg  10 mg Rectal Daily PRN Hooten, Laurice Record, MD      . ceFAZolin (ANCEF) IVPB 2g/100 mL premix  2 g Intravenous Q6H Hooten, Laurice Record, MD      . celecoxib (CELEBREX) capsule 200 mg  200 mg Oral BID Hooten, Laurice Record, MD      . celecoxib (CELEBREX) capsule 400 mg  400 mg Oral Once Hooten, Laurice Record, MD      . diphenhydrAMINE (BENADRYL) 12.5 MG/5ML elixir 12.5-25 mg  12.5-25 mg Oral Q4H  PRN Dereck Leep, MD      . Derrill Memo ON 07/15/2017] enoxaparin (LOVENOX) injection 30 mg  30 mg Subcutaneous Q12H Hooten, Laurice Record, MD      . ferrous sulfate tablet 325 mg  325 mg Oral BID WC Hooten, Laurice Record, MD      . fluticasone (FLONASE) 50 MCG/ACT nasal spray 1 spray  1 spray Each Nare Daily PRN Hooten, Laurice Record, MD      . gabapentin (NEURONTIN) capsule 300 mg  300 mg Oral QHS Hooten, Laurice Record, MD      . hydrochlorothiazide (MICROZIDE) capsule 12.5 mg  12.5 mg Oral Daily Hooten, Laurice Record, MD      . HYDROmorphone (DILAUDID) injection 0.5-1 mg  0.5-1 mg Intravenous Q4H PRN Hooten, Laurice Record, MD      . lactobacillus acidophilus & bulgar  (LACTINEX) chewable tablet 1 tablet  1 tablet Oral Daily Hooten, Laurice Record, MD      . losartan (COZAAR) tablet 50 mg  50 mg Oral Daily Hooten, Laurice Record, MD      . magnesium hydroxide (MILK OF MAGNESIA) suspension 30 mL  30 mL Oral Daily PRN Hooten, Laurice Record, MD      . magnesium oxide (MAG-OX) tablet 400 mg  400 mg Oral Daily Hooten, Laurice Record, MD      . menthol-cetylpyridinium (CEPACOL) lozenge 3 mg  1 lozenge Oral PRN Hooten, Laurice Record, MD       Or  . phenol (CHLORASEPTIC) mouth spray 1 spray  1 spray Mouth/Throat PRN Hooten, Laurice Record, MD      . metoCLOPramide (REGLAN) tablet 5-10 mg  5-10 mg Oral Q8H PRN Hooten, Laurice Record, MD       Or  . metoCLOPramide (REGLAN) injection 5-10 mg  5-10 mg Intravenous Q8H PRN Hooten, Laurice Record, MD      . metoCLOPramide (REGLAN) tablet 10 mg  10 mg Oral TID AC & HS Hooten, Laurice Record, MD      . montelukast (SINGULAIR) tablet 10 mg  10 mg Oral Daily PRN Hooten, Laurice Record, MD      . multivitamin with minerals tablet 1 tablet  1 tablet Oral Daily Hooten, Laurice Record, MD      . omega-3 acid ethyl esters (LOVAZA) capsule 1,000 mg  1,000 mg Oral Daily Hooten, Laurice Record, MD      . ondansetron (ZOFRAN) tablet 4 mg  4 mg Oral Q6H PRN Hooten, Laurice Record, MD       Or  . ondansetron (ZOFRAN) injection 4 mg  4 mg Intravenous Q6H PRN Hooten, Laurice Record, MD      . oxyCODONE (Oxy IR/ROXICODONE) immediate release tablet 10 mg  10 mg Oral Q4H PRN Hooten, Laurice Record, MD      . oxyCODONE (Oxy IR/ROXICODONE) immediate release tablet 5 mg  5 mg Oral Q4H PRN Hooten, Laurice Record, MD      . pantoprazole (PROTONIX) EC tablet 40 mg  40 mg Oral BID Hooten, Laurice Record, MD      . potassium chloride (K-DUR,KLOR-CON) CR tablet 10 mEq  10 mEq Oral Daily Hooten, Laurice Record, MD      . senna-docusate (Senokot-S) tablet 1 tablet  1 tablet Oral BID Hooten, Laurice Record, MD      . sodium phosphate (FLEET) 7-19 GM/118ML enema 1 enema  1 enema Rectal Once PRN Hooten, Laurice Record, MD      . traMADol (ULTRAM) tablet 50-100 mg  50-100 mg Oral Q4H PRN  Hooten, Laurice Record, MD         Discharge Medications: Please see discharge summary for a list of discharge medications.  Relevant Imaging Results:  Relevant Lab Results:   Additional Information (SSN: [id number])  Colbie Sliker, Veronia Beets, LCSW

## 2017-07-15 ENCOUNTER — Encounter: Payer: Self-pay | Admitting: Orthopedic Surgery

## 2017-07-15 MED ORDER — OXYCODONE HCL 5 MG PO TABS: 5.0000 mg | ORAL_TABLET | ORAL | 0 refills | Status: DC | PRN

## 2017-07-15 MED ORDER — TRAMADOL HCL 50 MG PO TABS: 50.0000 mg | ORAL_TABLET | ORAL | 0 refills | Status: DC | PRN

## 2017-07-15 MED ORDER — ENOXAPARIN SODIUM 30 MG/0.3ML ~~LOC~~ SOLN: 30.0000 mg | Freq: Two times a day (BID) | SUBCUTANEOUS | 0 refills | Status: DC

## 2017-07-15 MED ORDER — MAGNESIUM HYDROXIDE 400 MG/5ML PO SUSP
30.0000 mL | Freq: Every day | ORAL | Status: DC
  Administered 2017-07-15: 30 mL via ORAL
  Filled 2017-07-15: qty 30

## 2017-07-15 NOTE — Discharge Summary (Signed)
Physician Discharge Summary  Patient ID: Roberta Hanson MRN: [id number] DOB/AGE: [birth year] 77 y.o.  Admit date: 07/14/2017 Discharge date: 07/16/2017  Admission Diagnoses:  [place] LEFT   Discharge Diagnoses: Patient Active Problem List   Diagnosis Date Noted  . 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 Primary osteoarthritis of right knee 12/23/2016  . 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 Irritable bowel syndrome 07/04/2015  . Tinnitus, bilateral 01/02/2015  . Benign essential hypertension 01/01/2014  . Degenerative disc disease, cervical 01/01/2014  . Eosinophil count raised 01/01/2014  . GERD (gastroesophageal reflux disease) 01/01/2014  . Mixed hyperlipidemia 01/01/2014    Past Medical History:  Diagnosis Date  . Anemia   . Colon polyp   . Degenerative disc disease, cervical 12/2013  . Diverticulitis   . Diverticulosis   . GERD (gastroesophageal reflux disease) 01/01/2014  . Hyperlipidemia, mixed   . Hypertension   . Irritable bowel syndrome   . Osteoarthritis      Transfusion: No transfusions during this admission   Consultants (if any):   Discharged Condition: Improved  Hospital Course: Roberta Hanson is an 77 y.o. 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          ASSISTANT: Vance Peper,  PA  ANESTHESIA: general  ESTIMATED BLOOD LOSS: 200 mL  FLUIDS REPLACED: 2000 mL of crystalloid  DRAINS: 2 medium Hemovac drains  IMPLANTS UTILIZED: Depuy Duraloc 50 mm dynamic locking ring, 28 mm cobalt chrome +8 mm femoral head (14/16 taper), 10 degree Duraloc [place] polyethylene insert, cancellus bone graft  INDICATIONS FOR SURGERY: Roberta Hanson is a 77 y.o. year old female who underwent left total hip arthroplasty in 1982 and subsequent revision surgery as per Dr. Earnestine Leys.  She has recently noted some shifting and relative instability to the left hip.  Radiographs demonstrated significant polyethylene wear with periarticular osteolysis involving the proximal femur.  After discussion of the risks and benefits of surgical intervention, the patient expressed understanding of the risks benefits and agree with plans for surgical intervention.    Patient tolerated the surgery well. No complications .Patient was taken to PACU where she was stabilized and then transferred to the orthopedic floor.  Patient started on Lovenox 30 mg q 12 hrs. Foot pumps applied bilaterally at 80 mm hgb. Heels elevated off bed with rolled towels. No evidence of DVT. Calves non tender. Negative Homan. Physical therapy started on day #1 for gait training and transfer with OT starting on  day #1 for ADL and assisted devices. Patient has done well with therapy. Ambulated 400 feet upon being discharged.  Patient's IV And Foley were discontinued on day #1 with Hemovac being discontinued on day #2. Dressing was changed on day 2 prior to patient being discharged   She was given perioperative antibiotics:  Anti-infectives (From admission, onward)   Start     Dose/Rate Route Frequency Ordered Stop   07/14/17 1800  ceFAZolin (ANCEF) IVPB 2g/100 mL premix     2 g 200 mL/hr over 30 Minutes Intravenous Every 6 hours 07/14/17 1638 07/15/17 1230   07/14/17 0935  ceFAZolin (ANCEF) 2-4 GM/100ML-% IVPB     Note to Pharmacy:  Milinda Cave   : cabinet override      07/14/17 0935 07/14/17 1154   07/14/17 0600  ceFAZolin (ANCEF) IVPB 2g/100 mL premix     2 g 200 mL/hr over 30 Minutes Intravenous On call to O.R. 07/13/17 2315 07/14/17 1206    .  She was fitted with AV 1 compression foot pump devices, instructed on heel pumps, early ambulation, and fitted with TED stockings bilaterally for DVT prophylaxis.  She benefited maximally from the hospital stay and there were no complications.    Recent vital signs:  Vitals:   07/15/17 1624 07/15/17 2355  BP: 128/63 (!) 96/45  Pulse:  74  Resp:  18  Temp:  97.6 F (36.4 C)  SpO2:  98%    Recent laboratory studies:  Lab Results  Component Value Date   HGB 13.3 07/14/2017   HGB 12.7 07/01/2017   HGB 13.1 12/20/2015   Lab Results  Component Value Date   WBC 6.7 07/01/2017   PLT 180 07/01/2017   Lab Results  Component Value Date   INR 0.95 07/01/2017   Lab Results  Component Value Date   NA 140 07/14/2017   K 3.8 07/14/2017   CL 102 07/01/2017   CO2 32 07/01/2017   BUN 12 07/01/2017   CREATININE 0.43 (L) 07/01/2017   GLUCOSE 96 07/14/2017    Discharge Medications:   Allergies as of 07/16/2017      Reactions   Sulfa Antibiotics Rash   Lisinopril Cough   Citalopram Other (See Comments)   Feels aggitated      Medication List    TAKE these medications   enoxaparin 30 MG/0.3ML injection Commonly known as:  LOVENOX Inject 0.3 mLs (30 mg total) into the skin every 12 (twelve) hours. Start taking on:  07/17/2017   EQ COMPLETE MULTIVITAMIN-ADULT PO Take 1 tablet by mouth daily.   fluticasone 50 MCG/ACT nasal spray Commonly known as:  FLONASE Place 1 spray into both nostrils as needed for allergies or rhinitis.   GNP GARLIC EXTRACT PO Take 2 capsules by mouth daily.   hydrochlorothiazide 12.5 MG capsule Commonly known as:  MICROZIDE Take 12.5 mg by mouth daily.   LACTOBACILLUS BIFIDUS PO Take 1 tablet by mouth  daily.   losartan 50 MG tablet Commonly known as:  COZAAR Take 50 mg by mouth daily.   Magnesium Oxide 500 MG Tabs Take 1 tablet by mouth daily.   montelukast 10 MG tablet Commonly known as:  SINGULAIR Take 10 mg by mouth daily as needed.   OMEGA-3 FISH OIL PO Take 1,040 mg by mouth daily.   oxyCODONE 5 MG immediate release tablet Commonly known as:  Oxy IR/ROXICODONE Take 1 tablet (5 mg total) by mouth every 4 (four) hours as needed for moderate pain (pain score 4-6).   pantoprazole 40 MG tablet Commonly known as:  PROTONIX Take 1 tablet by mouth daily.   potassium chloride 10 MEQ tablet Commonly known as:  K-DUR,KLOR-CON Take 1 tablet by mouth daily.   traMADol 50 MG tablet Commonly known as:  ULTRAM Take 1-2 tablets (50-100 mg total) by mouth every 4 (four) hours as needed for moderate pain.            Durable Medical  Equipment  (From admission, onward)        Start     Ordered   07/14/17 1638  DME Walker rolling  Once    Question:  Patient needs a walker to treat with the following condition  Answer:  S/P total hip arthroplasty   07/14/17 1638   07/14/17 1638  DME Bedside commode  Once    Question:  Patient needs a bedside commode to treat with the following condition  Answer:  S/P total hip arthroplasty   07/14/17 1638      Diagnostic Studies: Nm Bone Scan 3 Phase  Result Date: 06/16/2017 CLINICAL DATA:  Progressive soreness of left hip. Status post left hip arthroplasty in 1992 and 2007. Right hip arthroplasty 2003. EXAM: NUCLEAR MEDICINE 3-PHASE BONE SCAN TECHNIQUE: Radionuclide angiographic images, immediate static blood pool images, and 3-hour delayed static images were obtained of the pelvis after intravenous injection of radiopharmaceutical. RADIOPHARMACEUTICALS:  22.07 mCi Tc-31m MDP IV COMPARISON:  None FINDINGS: Vascular phase: No abnormal asymmetric increased radiotracer uptake identified. Blood pool phase: There is mild asymmetric increased  radiotracer uptake localizing to the area around the greater trochanter of the proximal left femur. Delayed phase: A medium size focus of intense uptake localizes to the greater trochanter of the proximal left femur. IMPRESSION: 1. There is asymmetric increased uptake localizing to the greater trochanter of the proximal left femur on the blood pool and delayed phase images only. No abnormal increased blood flow on the arterial phase images. Given the absence of asymmetric increased flow to this area findings are equivocal for loosening or infection. Correlation with plain film radiographs advise. Electronically Signed   By: Kerby Moors M.D.   On: 06/16/2017 09:50   Dg Hip Port Unilat With Pelvis 1v Left  Result Date: 07/14/2017 CLINICAL DATA:  Post LEFT hip surgery EXAM: DG HIP (WITH OR WITHOUT PELVIS) 1V PORT LEFT COMPARISON:  Portable exam 1527 hours compared to 08/13/2012 FINDINGS: BILATERAL hip prostheses identified. Bones demineralized. No acute fracture, dislocation or bone destruction. Soft tissue gas and surgical drain at the LEFT hip region. SI joints preserved. IMPRESSION: Osseous demineralization with BILATERAL hip prostheses. No acute bony findings. Electronically Signed   By: Lavonia Dana M.D.   On: 07/14/2017 16:12    Disposition:   Discharge Instructions    Diet - low sodium heart healthy   Complete by:  As directed    Increase activity slowly   Complete by:  As directed       Follow-up Information    Hooten, Laurice Record, MD On 08/26/2017.   Specialty:  Orthopedic Surgery Why:  at 2:00pm Contact information: [place] [address] [phone]            Signed: Prescott Parma, Tayra Dawe 07/16/2017, 6:02 AM

## 2017-07-15 NOTE — Care Management Note (Signed)
Case Management Note  Patient Details  Name: Roberta Hanson MRN: [id number] Date of Birth: Sep 16, 1940  Subjective/Objective:  POD # 1 left hip revision. Met with patient as she was sitting up in chair at bedside. She lives at home with her son. Her daughter will be staying with her as well. She will need a walker and bsc. Offered choice of home health providers. [facility] choose [facility]. [facility] unable to accept patients insurance. Referral to [facility] for PT. Pharmacy: [facility]: [phone]                   Action/Plan: [facility] for HHPT, Walker and BSC. Cost of Lovenox is $ 111.11. Patient updated and denies issues paying for medication.   Expected Discharge Date:  07/16/17               Expected Discharge Plan:  [place]  In-House Referral:     Discharge planning Services  CM Consult  Post Acute Care Choice:  Durable Medical Equipment Choice offered to:  Patient  DME Arranged:  Bedside commode, Walker rolling DME Agency:  [place]:  PT [facility] Agency:  [place]  Status of Service:  In process, will continue to follow  If discussed at Long Length of Stay Meetings, dates discussed:    Additional Comments:  Jolly Mango, RN 07/15/2017, 1:57 PM

## 2017-07-15 NOTE — Progress Notes (Signed)
ORTHOPAEDICS PROGRESS NOTE  PATIENT NAME: Roberta Hanson DOB: [birth year]  MRN: [id number]  POD # 1: Left hip revision  Subjective: The patient states the pain is been under good control.  No nausea or vomiting.  Good appetite.  Objective: Vital signs in last 24 hours: Temp:  [97.2 F (36.2 C)-98.5 F (36.9 C)] 97.7 F (36.5 C) (05/16 0326) Pulse Rate:  [65-92] 65 (05/16 0326) Resp:  [12-17] 17 (05/16 0326) BP: (94-163)/(52-86) 94/60 (05/16 0326) SpO2:  [93 %-100 %] 95 % (05/16 0326) Weight:  [86 kg (189 lb 8 oz)] 86 kg (189 lb 8 oz) (05/15 1645)  Intake/Output from previous day: 05/15 0701 - 05/16 0700 In: 2000 [I.V.:2000] Out: 2530 [Urine:2200; Drains:130; Blood:200]  Recent Labs    07/14/17 1011  HGB 13.3  HCT 39.0  K 3.8  GLUCOSE 96    EXAM General: Well-developed well-nourished female seen in no apparent discomfort. Lungs: clear to auscultation Cardiac: normal rate and regular rhythm Left lower extremity: Hemovac drain is in place and functioning.  Dressing is dry and intact.  No significant thigh swelling or ecchymosis.  Homans test is negative. Neurologic: Awake, alert, and oriented.  Sensory and motor function are grossly intact.  Assessment: Left hip revision (polyethylene liner and femoral head exchange, bone grafting of osteolytic lesions to the femur)  Secondary diagnoses: Irritable bowel syndrome Hypertension Hyperlipidemia Gastroesophageal reflux disease Diverticulosis History of anemia  Plan: Today's goals were reviewed with the patient.  Posterior hip precautions are to be maintained. Begin physical therapy and Occupational Therapy as per total hip arthroplasty rehab protocol. Plan is to go Home after hospital stay. DVT Prophylaxis - Lovenox, Foot Pumps and TED hose  James P. Holley Bouche M.D.

## 2017-07-15 NOTE — Evaluation (Signed)
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  Pt has posterior hip precautions.     Clinical Impression   Pt seen for OT evaluation this date, POD#1 from above surgery. Pt was independent in all ADLs prior to surgery and is eager to return to PLOF with less pain and improved safety and independence. Pt currently requires minimal assist for LB dressing and bathing while in seated position due to pain and limited AROM of L hip while maintaining posterior total hip precautions. Pt able to recall 3/3 posterior total hip precautions at start of session, however, unable to verbalize how to implement during ADL and mobility. >23 minutes spent aside from evaluation on instruction in posterior total hip precautions and how to implement, self care skills, falls prevention strategies, home/routines modifications, DME/AE for LB bathing and dressing tasks, and compression stocking mgt strategies. At end of session pt able to verbalize and teach back 3/3 precautions and how to implement during functional transfers, bathing, and dressing tasks. Pt would benefit from additional skilled OT services while in the hospital focused on instruction in self care skills and techniques to help maintain precautions with or without assistive devices to support recall and carryover prior to discharge. Recommend home upon discharge; do not anticipate any follow up OT needs once home.      Follow Up Recommendations  No OT follow up    Equipment Recommendations  3 in 1 bedside commode;Other (comment)(reacher, sock aid)    Recommendations for Other Services       Precautions / Restrictions  Precautions Precautions: Fall;Posterior Hip Precaution Booklet Issued: No Precaution Comments: Pt able to recall 3/3 initially but unable to verbalize how to maintain during functional mobility and ADL tasks. Restrictions Weight Bearing Restrictions: Yes LLE Weight Bearing: Weight bearing as tolerated      Mobility Bed Mobility            General bed mobility comments: deferred, up in recliner  Transfers Overall transfer level: Needs assistance Equipment used: Rolling walker (2 wheeled) Transfers: Sit to/from Stand Sit to Stand: Min guard         General transfer comment: VC for precautions    Balance Overall balance assessment: Needs assistance Sitting-balance support: No upper extremity supported;Feet supported Sitting balance-Leahy Scale: Good     Standing balance support: No upper extremity supported;During functional activity Standing balance-Leahy Scale: Fair Standing balance comment: Pt able to stand statically but relies on UE support for dynamic activities                           ADL either performed or assessed with clinical judgement   ADL Overall ADL's : Needs assistance/impaired Eating/Feeding: Sitting;Independent   Grooming: Min guard;Standing   Upper Body Bathing: Sitting;Set up;Supervision/ safety   Lower Body Bathing: Sit to/from stand;Minimal assistance Lower Body Bathing Details (indicate cue type and reason): instructed in techniques to minimize bending past 90 degrees Upper Body Dressing : Sitting;Independent   Lower Body Dressing: Sit to/from stand;Minimal assistance;With adaptive equipment Lower Body Dressing Details (indicate cue type and reason): instructed in AE for LB dressing, strategies for compression stocking mgt - pt initially verbalizing confidence that she  could perform independently to don stockings and clothing requiring further instruction and visual demonstration with AE to maximize understanding (pt verbalizes  plan to have neighbor or daughter don/doff the compression stockings) Toilet Transfer: RW;Min guard;Comfort height toilet;Ambulation                   Vision Baseline Vision/History: Wears glasses Wears Glasses: Distance only Patient Visual Report: No change from baseline       Perception     Praxis      Pertinent Vitals/Pain Pain Assessment: No/denies pain     Hand Dominance     Extremity/Trunk Assessment Upper Extremity Assessment Upper Extremity Assessment: Overall WFL for tasks assessed   Lower Extremity Assessment Lower Extremity Assessment: Defer to PT evaluation;LLE deficits/detail LLE Deficits / Details: Strength deficits as expected s/p revision L hip.     Cervical / Trunk Assessment Cervical / Trunk Assessment: Normal   Communication Communication Communication: No difficulties   Cognition Arousal/Alertness: Awake/alert Behavior During Therapy: WFL for tasks assessed/performed Overall Cognitive Status: Within Functional Limits for tasks assessed                                     General Comments       Exercises Other Exercises Other Exercises: Pt instructed in falls prevention strategies   Shoulder Instructions      Home Living Family/patient expects to be discharged to:: Private residence Living Arrangements: Children(son) Available Help at Discharge: Family;Available PRN/intermittently(son works until [employer] daily, daughter and neighbor available (all are CNAs)) Type of Home: House Home Access: Stairs to enter Technical brewer of Steps: 2 Entrance Stairs-Rails: Left Home Layout: One level     Bathroom Shower/Tub: Tub/shower unit;Curtain   Bathroom Toilet: Handicapped height     Home Equipment: [place] - single point;Grab bars - tub/shower          Prior Functioning/Environment Level of Independence: Independent        Comments: Pt ambulating without AD at baseline, no falls in the past 6 months.  Pt  independent with all ADLs, IADLs.  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          OT Problem List: Decreased knowledge of precautions;Decreased knowledge of use of DME or AE;Impaired balance (sitting and/or standing)      OT Treatment/Interventions: Self-care/ADL training;Balance training;Therapeutic activities;DME and/or AE instruction;Patient/family education    OT Goals(Current goals can be found in the care plan section) Acute Rehab OT Goals Patient Stated Goal: return home and return to PLOF OT Goal Formulation: With patient Time For Goal Achievement: 07/29/17 Potential to Achieve Goals: Good ADL Goals Pt Will Perform Lower Body Dressing: with supervision;sit to/from stand;with adaptive equipment(maintaining posterior THPs, RW when in standing) Pt Will Transfer to Toilet: ambulating(RW to amb, BSC frame over commode, maintain posterior THPs) Additional ADL Goal #1: Pt will verbalize 3/3 posterior THPs with no verbal cues and demonstrate how to maintain during transfers and LB dressing tasks 5/5 opportunities.  OT Frequency: Min 1X/week   Barriers to D/C:            Co-evaluation              AM-PAC PT "6 Clicks" Daily Activity     Outcome Measure Help from another person eating meals?: None Help from another person taking care of personal grooming?: None Help from another person toileting, which includes using toliet, bedpan,  or urinal?: A Little Help from another person bathing (including washing, rinsing, drying)?: A Little Help from another person to put on and taking off regular upper body clothing?: None Help from another person to put on and taking off regular lower body clothing?: A Little 6 Click Score: 21   End of Session    Activity Tolerance: Patient tolerated treatment well Patient left: in chair;with call bell/phone within reach;with chair alarm set;with SCD's reapplied  OT Visit Diagnosis: Other abnormalities of gait and mobility (R26.89)                 Time: 9826-4158 OT Time Calculation (min): 34 min Charges:  OT General Charges $OT Visit: 1 Visit OT Evaluation $OT Eval Low Complexity: 1 Low OT Treatments $Self Care/Home Management : 23-37 mins  Jeni Salles, MPH, MS, OTR/L ascom [phone] 07/15/17, 12:15 PM'

## 2017-07-15 NOTE — Progress Notes (Signed)
Clinical Social Worker (CSW) received SNF consult. PT is recommending home health. RN case manager aware of above. Please reconsult if future social work needs arise. CSW signing off.   Sabah Zucco, LCSW [phone] 

## 2017-07-15 NOTE — Evaluation (Signed)
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  Pt has posterior hip precautions.      Clinical Impression  Patient is s/p above surgery resulting in functional limitations due to the deficits listed below (see PT Problem List). Roberta Hanson was independent with all aspects of mobility at baseline.  She requires min guard assist for bed mobility and sit<>stand transfers.  Pt demonstrated a steady gait using RW, ambulating 200 ft with supervision. Please see comments below regarding precautions. Patient will benefit from skilled PT to increase their independence and safety with mobility to allow discharge to the venue listed below.      Follow Up Recommendations Home health PT    Equipment Recommendations  Rolling walker with 5" wheels;3in1 (PT)    Recommendations for Other Services OT consult     Precautions / Restrictions Precautions Precautions: Fall;Posterior Hip Precaution Booklet Issued: Yes (comment) Precaution Comments: Provided pt with booklet and explained and demonstrated posterior precautions.  Pt recalls functional implications well but consistently can only recall 2/3 hip precautions.  Restrictions Weight Bearing Restrictions: Yes LLE Weight Bearing: Weight bearing as tolerated      Mobility  Bed Mobility Overal bed mobility: Needs Assistance Bed Mobility: Supine to Sit     Supine to sit: Min guard;HOB elevated     General bed mobility comments: Increased time and effort.  Pt adheres to posterior precautions without cues needed.  Use of bed rail.   Transfers Overall transfer level: Needs assistance Equipment used: Rolling walker (2  wheeled) Transfers: Sit to/from Stand Sit to Stand: Min guard         General transfer comment: Reminder cues for proper technique to adhere to posterior hip precautions.  Cues for hand placement.  No instability noted.   Ambulation/Gait Ambulation/Gait assistance: Supervision Ambulation Distance (Feet): 200 Feet Assistive device: Rolling walker (2 wheeled) Gait Pattern/deviations: Step-to pattern;Step-through pattern;Decreased stance time - left;Decreased weight shift to left Gait velocity: decreased   General Gait Details: Pt initially with step to pattern with cues for sequencing using RW.  Pt advances to step through pattern.  No signs of instability.    Stairs            Wheelchair Mobility    Modified Rankin (Stroke Patients Only)       Balance Overall balance assessment: Needs assistance Sitting-balance support: No upper extremity supported;Feet supported Sitting balance-Leahy Scale: Good     Standing balance support: No upper extremity supported;During functional activity Standing balance-Leahy Scale: Fair Standing balance comment: Pt able to stand statically but relies on UE support for dynamic activities                             Pertinent Vitals/Pain Pain Assessment: No/denies pain    Home Living Family/patient expects to be discharged to:: Private residence Living Arrangements: Children(son) Available Help at Discharge: Family;Available PRN/intermittently Type of Home: House Home Access: Stairs to enter Entrance Stairs-Rails: Left Entrance Stairs-Number of Steps: 2 Home Layout: One level Home Equipment: Cane - single point;Grab bars - tub/shower      Prior Function Level of Independence: Independent         Comments:  Pt ambulating without AD at baseline, no falls in the past 6 months.  Pt independent with all ADLs, IADLs.  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       Hand Dominance        Extremity/Trunk Assessment    Upper Extremity Assessment Upper Extremity Assessment: Defer to OT evaluation    Lower Extremity Assessment Lower Extremity Assessment: LLE deficits/detail LLE Deficits / Details: Strength deficits as expected s/p revision L hip.      Cervical / Trunk Assessment Cervical / Trunk Assessment: Normal  Communication   Communication: No difficulties  Cognition Arousal/Alertness: Awake/alert Behavior During Therapy: WFL for tasks assessed/performed Overall Cognitive Status: Within Functional Limits for tasks assessed                                        General Comments      Exercises Total Joint Exercises Ankle Circles/Pumps: AROM;Both;10 reps;Supine Quad Sets: Strengthening;Both;10 reps;Supine Hip ABduction/ADduction: Strengthening;AROM;Left;10 reps;Supine   Assessment/Plan    PT Assessment Patient needs continued PT services  PT Problem List Decreased strength;Decreased range of motion;Decreased activity tolerance;Decreased balance;Decreased knowledge of use of DME;Decreased safety awareness;Decreased knowledge of precautions;Pain       PT Treatment Interventions DME instruction;Gait training;Stair training;Functional mobility training;Therapeutic activities;Neuromuscular re-education;Therapeutic exercise;Balance training;Patient/family education;Modalities    PT Goals (Current goals can be found in the Care Plan section)  Acute Rehab PT Goals Patient Stated Goal: to return home at d/c PT Goal Formulation: With patient Time For Goal Achievement: 07/29/17 Potential to Achieve Goals: Good    Frequency BID   Barriers to discharge        Co-evaluation               AM-PAC PT "6 Clicks" Daily Activity  Outcome Measure Difficulty turning over in bed (including adjusting bedclothes, sheets and blankets)?: Unable Difficulty moving from lying on back to sitting on the side of the bed? : Unable Difficulty sitting down on and standing up from a  chair with arms (e.g., wheelchair, bedside commode, etc,.)?: A Little Help needed moving to and from a bed to chair (including a wheelchair)?: A Little Help needed walking in hospital room?: A Little Help needed climbing 3-5 steps with a railing? : A Little 6 Click Score: 14    End of Session Equipment Utilized During Treatment: Gait belt Activity Tolerance: Patient tolerated treatment well Patient left: in chair;with call bell/phone within reach;with chair alarm set Nurse Communication: Mobility status;Precautions;Weight bearing status PT Visit Diagnosis: Pain;Unsteadiness on feet (R26.81);Other abnormalities of gait and mobility (R26.89);Muscle weakness (generalized) (M62.81) Pain - Right/Left: Left Pain - part of body: Hip    Time: 3295-1884 PT Time Calculation (min) (ACUTE ONLY): 27 min   Charges:   PT Evaluation $PT Eval Low Complexity: 1 Low PT Treatments $Gait Training: 8-22 mins   PT G Codes:        Collie Siad PT, DPT 07/15/2017, 11:24 AM

## 2017-07-15 NOTE — Anesthesia Postprocedure Evaluation (Signed)
Anesthesia Post Note  Patient: Roberta Hanson  Procedure(s) Performed: TOTAL HIP REVISION (Left )  Patient location during evaluation: PACU Anesthesia Type: General Level of consciousness: awake and alert Pain management: pain level controlled Vital Signs Assessment: post-procedure vital signs reviewed and stable Respiratory status: spontaneous breathing, nonlabored ventilation, respiratory function stable and patient connected to nasal cannula oxygen Cardiovascular status: blood pressure returned to baseline and stable Postop Assessment: no apparent nausea or vomiting Anesthetic complications: no     Last Vitals:  Vitals:   07/14/17 2327 07/15/17 0326  BP: 121/74 94/60  Pulse: 80 65  Resp: 16 17  Temp: 36.6 C 36.5 C  SpO2: 96% 95%    Last Pain:  Vitals:   07/15/17 0530  TempSrc:   PainSc: 0-No pain                 Precious Haws Piscitello

## 2017-07-15 NOTE — Progress Notes (Signed)
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  Pt has posterior hip precautions.      PT Comments    Roberta Hanson made excellent progress with mobility this session.  She ambulated 400 ft with supervision with no signs of instability.  Pt complete stair training, performing step to pattern sideways holding onto rail with supervision for safety but no signs of instability.  Pt was able to recall 3/3 hip precautions x2 this session. Pt will benefit from continued skilled PT services to increase functional independence and safety.    Follow Up Recommendations  Home health PT     Equipment Recommendations  Rolling walker with 5" wheels;3in1 (PT)    Recommendations for Other Services       Precautions / Restrictions Precautions Precautions: Fall;Posterior Hip Precaution Booklet Issued: Yes (comment) Precaution Comments: Pt able to recall 3/3 hip precautions x2 this session as well as verbalize and demonstrate functional implications Restrictions Weight Bearing Restrictions: Yes LLE Weight Bearing: Weight bearing as tolerated    Mobility  Bed Mobility               General bed mobility comments: Pt sitting in chair at start and end of session  Transfers Overall transfer level: Needs assistance Equipment used: Rolling walker (2 wheeled) Transfers: Sit to/from Stand Sit to Stand: Supervision         General transfer comment: Pt demonstrates proper hand placement, safe technique, and adherence to hip precautions.    Ambulation/Gait Ambulation/Gait assistance: Supervision Ambulation Distance (Feet): 400 Feet Assistive device: Rolling walker (2  wheeled) Gait Pattern/deviations: Step-through pattern;Decreased stance time - left;Decreased weight shift to left Gait velocity: decreased   General Gait Details: Pt demonstrates step through gait pattern.  No signs of instability.  Cues for forward gaze.     Stairs Stairs: Yes Stairs assistance: Supervision Stair Management: One rail Left;Step to pattern;Sideways Number of Stairs: 4 General stair comments: Demonstration and cues for proper and safe technique.  No signs of instability.  Supervision for safety.    Wheelchair Mobility    Modified Rankin (Stroke Patients Only)       Balance Overall balance assessment: Needs assistance Sitting-balance support: No upper extremity supported;Feet supported Sitting balance-Leahy Scale: Good     Standing balance support: No upper extremity supported;During functional activity Standing balance-Leahy Scale: Fair Standing balance comment: Pt able to stand statically but relies on UE support for dynamic activities                            Cognition Arousal/Alertness: Awake/alert Behavior During Therapy: WFL for tasks assessed/performed Overall Cognitive Status: Within Functional Limits for tasks assessed                                        Exercises Total Joint Exercises Ankle Circles/Pumps: AROM;Both;10 reps;Seated Quad Sets: Strengthening;Both;10 reps;Seated Hip ABduction/ADduction: Strengthening;AROM;Left;10 reps;Seated Long Arc Quad: Strengthening;Left;10 reps;Seated Knee Flexion: AROM;Left;10 reps;Standing Other Exercises Other Exercises: Pt instructed in falls prevention strategies    General Comments        Pertinent Vitals/Pain  Pain Assessment: Faces Faces Pain Scale: Hurts a little bit Pain Location: L hip with therapeutic exercises Pain Descriptors / Indicators: Discomfort Pain Intervention(s): Limited activity within patient's tolerance;Monitored during session    Home  Living Family/patient expects to be discharged to:: Private residence Living Arrangements: Children(son) Available Help at Discharge: Family;Available PRN/intermittently(son works until [employer] daily, daughter and neighbor available (all are CNAs)) Type of Home: House Home Access: Stairs to enter Entrance Stairs-Rails: Left Home Layout: One level Home Equipment: Cane - single point;Grab bars - tub/shower      Prior Function Level of Independence: Independent      Comments: Pt ambulating without AD at baseline, no falls in the past 6 months.  Pt independent with all ADLs, IADLs.  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     PT Goals (current goals can now be found in the care plan section) Acute Rehab PT Goals Patient Stated Goal: to return home at d/c PT Goal Formulation: With patient Time For Goal Achievement: 07/29/17 Potential to Achieve Goals: Good Progress towards PT goals: Progressing toward goals    Frequency    BID      PT Plan Current plan remains appropriate    Co-evaluation              AM-PAC PT "6 Clicks" Daily Activity  Outcome Measure  Difficulty turning over in bed (including adjusting bedclothes, sheets and blankets)?: Unable Difficulty moving from lying on back to sitting on the side of the bed? : Unable Difficulty sitting down on and standing up from a chair with arms (e.g., wheelchair, bedside commode, etc,.)?: A Little Help needed moving to and from a bed to chair (including a wheelchair)?: A Little Help needed walking in hospital room?: A Little Help needed climbing 3-5 steps with a railing? : A Little 6 Click Score: 14    End of Session Equipment Utilized During Treatment: Gait belt Activity Tolerance: Patient tolerated treatment well Patient left: in chair;with call bell/phone within reach;with chair alarm set Nurse Communication: Mobility status PT Visit Diagnosis: Pain;Unsteadiness on feet (R26.81);Other abnormalities of gait and  mobility (R26.89);Muscle weakness (generalized) (M62.81) Pain - Right/Left: Left Pain - part of body: Hip     Time: 4008-6761 PT Time Calculation (min) (ACUTE ONLY): 18 min  Charges:  $Gait Training: 8-22 mins                    G Codes:       Collie Siad PT, DPT 07/15/2017, 3:47 PM

## 2017-07-16 LAB — TYPE AND SCREEN
ABO/RH(D): O POS
Antibody Screen: NEGATIVE
Unit division: 0
Unit division: 0

## 2017-07-16 LAB — BPAM RBC
Blood Product Expiration Date: 201906012359
Blood Product Expiration Date: 201906012359
ISSUE DATE / TIME: 201905161528
UNIT TYPE AND RH: 5100
UNIT TYPE AND RH: 5100

## 2017-07-16 NOTE — Progress Notes (Signed)
Discharge instructions and medication details reviewed with patient and family. All questions answered. Patient verbalized understanding. Printed prescriptions and AVS given to patient.  IV removed. All patient belongings including BSC and walker packed. Patient escorted out via wheelchair.

## 2017-07-16 NOTE — Progress Notes (Signed)
  Subjective: 2 Days Post-Op Procedure(s) (LRB): TOTAL HIP REVISION (Left) Patient reports pain as mild.   Patient seen in rounds with Dr. Marry Guan. Patient is well, and has had no acute complaints or problems Plan is to go Home after hospital stay. Negative for chest pain and shortness of breath Fever: no Gastrointestinal: Negative for nausea and vomiting  Objective: Vital signs in last 24 hours: Temp:  [97.6 F (36.4 C)-98.6 F (37 C)] 97.6 F (36.4 C) (05/16 2355) Pulse Rate:  [72-85] 74 (05/16 2355) Resp:  [18] 18 (05/16 2355) BP: (89-128)/(45-86) 96/45 (05/16 2355) SpO2:  [98 %-100 %] 98 % (05/16 2355)  Intake/Output from previous day:  Intake/Output Summary (Last 24 hours) at 07/16/2017 0600 Last data filed at 07/16/2017 0522 Gross per 24 hour  Intake 3630 ml  Output 585 ml  Net 3045 ml    Intake/Output this shift: Total I/O In: -  Out: 25 [Drains:25]  Labs: Recent Labs    07/14/17 1011  HGB 13.3   Recent Labs    07/14/17 1011  HCT 39.0   Recent Labs    07/14/17 1011  NA 140  K 3.8  GLUCOSE 96   No results for input(s): LABPT, INR in the last 72 hours.   EXAM General - Patient is Alert and Oriented Extremity - Intact pulses distally Dorsiflexion/Plantar flexion intact No cellulitis present Compartment soft Dressing/Incision - clean, dry, no drainage, with the Hemovac removed Motor Function - intact, moving foot and toes well on exam.  Ambulated 400 feet with physical therapy  Past Medical History:  Diagnosis Date  . Anemia   . Colon polyp   . Degenerative disc disease, cervical 12/2013  . Diverticulitis   . Diverticulosis   . GERD (gastroesophageal reflux disease) 01/01/2014  . Hyperlipidemia, mixed   . Hypertension   . Irritable bowel syndrome   . Osteoarthritis     Assessment/Plan: 2 Days Post-Op Procedure(s) (LRB): TOTAL HIP REVISION (Left) Active Problems:   Status post total replacement of hip  Estimated body mass index is  35.81 kg/m as calculated from the following:   Height as of this encounter: 5\' 1"  (1.549 m).   Weight as of this encounter: 86 kg (189 lb 8 oz). Advance diet Up with therapy Discharge home with home health  DVT Prophylaxis - Lovenox, Foot Pumps and TED hose Weight-Bearing as tolerated to left leg  Reche Dixon, PA-C Orthopaedic Surgery 07/16/2017, 6:00 AM

## 2017-07-16 NOTE — Progress Notes (Signed)
Physical Therapy Treatment Patient Details Name: Roberta Hanson MRN: [id number] DOB: Apr 24, 1940 Today's Date: 07/16/2017    History of Present Illness Pt is a 77 y/o F s/p revision of L hip revision(polyethylene exchange and femoral head exchange), debridement of osteolytic lesions from the proximal femur, and allograft bone grafting of the proximal left femur.  Pt has posterior hip precautions.     PT Comments    Pt continues to make good progress.  Pt recalls all hip precautions without cues and demonstrated and verbalized understanding of functional implications.  Pt tolerated progression of therapeutic exercises this session.   Follow Up Recommendations  Home health PT     Equipment Recommendations  Rolling walker with 5" wheels;3in1 (PT)    Recommendations for Other Services       Precautions / Restrictions Precautions Precautions: Fall;Posterior Hip Precaution Booklet Issued: Yes (comment) Precaution Comments: Pt able to recall 3/3 hip precautions x2 this session as well as verbalize and demonstrate functional implications Restrictions Weight Bearing Restrictions: Yes LLE Weight Bearing: Weight bearing as tolerated    Mobility  Bed Mobility Overal bed mobility: Modified Independent Bed Mobility: Supine to Sit     Supine to sit: Modified independent (Device/Increase time)     General bed mobility comments: No cues or phsical assist required.  Pt adheres to hip precautions.   Transfers Overall transfer level: Needs assistance Equipment used: Rolling walker (2 wheeled) Transfers: Sit to/from Stand Sit to Stand: Supervision         General transfer comment: Pt demonstrates proper and safe technique and adheres to hip precautions.    Ambulation/Gait Ambulation/Gait assistance: Supervision Ambulation Distance (Feet): 360 Feet Assistive device: Rolling walker (2 wheeled) Gait Pattern/deviations: Step-through pattern;Decreased stance time -  left;Decreased weight shift to left Gait velocity: decreased   General Gait Details: Pt demonstrates step through gait pattern.  No signs of instability.    Stairs             Wheelchair Mobility    Modified Rankin (Stroke Patients Only)       Balance Overall balance assessment: Needs assistance Sitting-balance support: No upper extremity supported;Feet supported Sitting balance-Leahy Scale: Good     Standing balance support: No upper extremity supported;During functional activity Standing balance-Leahy Scale: Fair Standing balance comment: Pt able to stand statically without UE support but relies on UE support for dynamic activities                            Cognition Arousal/Alertness: Awake/alert Behavior During Therapy: WFL for tasks assessed/performed Overall Cognitive Status: Within Functional Limits for tasks assessed                                        Exercises Total Joint Exercises Hip ABduction/ADduction: Strengthening;Left;10 reps;Standing Knee Flexion: Strengthening;Left;10 reps;Standing Standing Hip Extension: Strengthening;Left;10 reps;Standing Other Exercises Other Exercises: Heel and toe raises x10 with BUE support    General Comments General comments (skin integrity, edema, etc.): BP taken in sitting at start of session 134/66.        Pertinent Vitals/Pain Pain Assessment: No/denies pain Pain Intervention(s): Monitored during session    Home Living                      Prior Function  PT Goals (current goals can now be found in the care plan section) Acute Rehab PT Goals Patient Stated Goal: to go home today PT Goal Formulation: With patient Time For Goal Achievement: 07/29/17 Potential to Achieve Goals: Good Progress towards PT goals: Progressing toward goals    Frequency    BID      PT Plan Current plan remains appropriate    Co-evaluation              AM-PAC  PT "6 Clicks" Daily Activity  Outcome Measure  Difficulty turning over in bed (including adjusting bedclothes, sheets and blankets)?: None Difficulty moving from lying on back to sitting on the side of the bed? : A Little Difficulty sitting down on and standing up from a chair with arms (e.g., wheelchair, bedside commode, etc,.)?: A Little Help needed moving to and from a bed to chair (including a wheelchair)?: A Little Help needed walking in hospital room?: A Little Help needed climbing 3-5 steps with a railing? : A Little 6 Click Score: 19    End of Session Equipment Utilized During Treatment: Gait belt Activity Tolerance: Patient tolerated treatment well Patient left: with call bell/phone within reach;in bed;with bed alarm set;Other (comment)(sitting EOB to begin washing up) Nurse Communication: Mobility status PT Visit Diagnosis: Pain;Unsteadiness on feet (R26.81);Other abnormalities of gait and mobility (R26.89);Muscle weakness (generalized) (M62.81) Pain - Right/Left: Left Pain - part of body: Hip     Time: 4259-5638 PT Time Calculation (min) (ACUTE ONLY): 17 min  Charges:  $Therapeutic Exercise: 8-22 mins                    G Codes:       Collie Siad PT, DPT 07/16/2017, 10:03 AM

## 2017-07-16 NOTE — Care Management Note (Signed)
Case Management Note  Patient Details  Name: Roberta Hanson MRN: [id number] Date of Birth: 1940/03/28  Subjective/Objective:  Discharging today                  Action/Plan: Advanced notified of discharge.   Expected Discharge Date:  07/16/17               Expected Discharge Plan:  [place]  In-House Referral:     Discharge planning Services  CM Consult  Post Acute Care Choice:  Durable Medical Equipment Choice offered to:  Patient  DME Arranged:  Bedside commode, Walker rolling DME Agency:  [place]:  PT [facility] Agency:  [place]  Status of Service:  In process, will continue to follow  If discussed at Long Length of Stay Meetings, dates discussed:    Additional Comments:  Jolly Mango, RN 07/16/2017, 8:38 AM

## 2017-07-20 ENCOUNTER — Telehealth: Payer: Self-pay

## 2017-07-20 ENCOUNTER — Other Ambulatory Visit: Payer: Medicare HMO

## 2017-07-20 NOTE — Telephone Encounter (Signed)
EMMI Follow-up: Roberta Hanson said she had received a call from my number and I explained our automated calls post discharge and to expect a second automated call soon.  She said therapy was going well and is feeling better. No needs at this time.

## 2017-08-30 DIAGNOSIS — Z96642 Presence of left artificial hip joint: Secondary | ICD-10-CM | POA: Insufficient documentation

## 2018-01-11 DIAGNOSIS — E876 Hypokalemia: Secondary | ICD-10-CM | POA: Insufficient documentation

## 2018-01-12 ENCOUNTER — Other Ambulatory Visit: Payer: Self-pay | Admitting: Internal Medicine

## 2018-01-12 DIAGNOSIS — Z1231 Encounter for screening mammogram for malignant neoplasm of breast: Secondary | ICD-10-CM

## 2018-01-19 ENCOUNTER — Ambulatory Visit
Admission: RE | Admit: 2018-01-19 | Discharge: 2018-01-19 | Disposition: A | Discharge status: Home / Self Care | Payer: Medicare HMO | Source: Ambulatory Visit | Attending: Internal Medicine | Admitting: Internal Medicine

## 2018-01-19 ENCOUNTER — Encounter (INDEPENDENT_AMBULATORY_CARE_PROVIDER_SITE_OTHER): Payer: Self-pay

## 2018-01-19 DIAGNOSIS — Z1231 Encounter for screening mammogram for malignant neoplasm of breast: Secondary | ICD-10-CM | POA: Diagnosis present

## 2018-04-10 ENCOUNTER — Ambulatory Visit
Admission: EM | Admit: 2018-04-10 | Discharge: 2018-04-10 | Disposition: A | Discharge status: Home / Self Care | Payer: Medicare HMO | Attending: Family Medicine | Admitting: Family Medicine

## 2018-04-10 ENCOUNTER — Other Ambulatory Visit: Payer: Self-pay

## 2018-04-10 DIAGNOSIS — J209 Acute bronchitis, unspecified: Secondary | ICD-10-CM | POA: Diagnosis not present

## 2018-04-10 MED ORDER — ALBUTEROL SULFATE HFA 108 (90 BASE) MCG/ACT IN AERS: 2.0000 | INHALATION_SPRAY | RESPIRATORY_TRACT | 0 refills | Status: DC | PRN

## 2018-04-10 MED ORDER — BENZONATATE 100 MG PO CAPS: 100.0000 mg | ORAL_CAPSULE | Freq: Three times a day (TID) | ORAL | 0 refills | Status: DC | PRN

## 2018-04-10 MED ORDER — DOXYCYCLINE HYCLATE 100 MG PO CAPS: 100.0000 mg | ORAL_CAPSULE | Freq: Two times a day (BID) | ORAL | 0 refills | Status: DC

## 2018-04-10 MED ORDER — HYDROCOD POLST-CPM POLST ER 10-8 MG/5ML PO SUER: 3.0000 mL | Freq: Every evening | ORAL | 0 refills | Status: DC | PRN

## 2018-04-10 NOTE — ED Triage Notes (Signed)
Pt reports "I think I have bronchitis." Has had cough after a URI that won't go away.

## 2018-04-10 NOTE — ED Provider Notes (Signed)
[facility] ____________________________________________  Time seen: Approximately 10:25 AM  I have reviewed the triage vital signs and the nursing notes.   HISTORY  Chief Complaint Cough   HPI Roberta Hanson is a 78 y.o. female presenting for evaluation of cough and congestion complaints present for the last 2 weeks.  States initially started off with a cold with more nasal congestion, drainage and a fever.  States his symptoms improved, but continues with a cough.  Does still have intermittent nasal congestion.  Denies recent fevers.  Overall continues to eat and drink well.  Unresolved with over-the-counter medication.  Denies chest pain, shortness of breath, abdominal pain, sore throat.  Denies home sick contacts.  Denies other relieving factors.  Reports similar to previous bronchitis infections.  Does report occasionally hear herself wheeze at night.  Reports otherwise doing well denies other complaints.  Ezequiel Kayser, MD: PCP    Past Medical History:  Diagnosis Date  . Anemia   . Colon polyp   . Degenerative disc disease, cervical 12/2013  . Diverticulitis   . Diverticulosis   . GERD (gastroesophageal reflux disease) 01/01/2014  . Hyperlipidemia, mixed   . Hypertension   . Irritable bowel syndrome   . Osteoarthritis     Patient Active Problem List   Diagnosis Date Noted  . 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 Primary osteoarthritis of right knee 12/23/2016  . 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 Irritable bowel syndrome 07/04/2015  . Tinnitus, bilateral 01/02/2015  . Benign essential hypertension 01/01/2014  . Degenerative disc disease, cervical 01/01/2014  . Eosinophil count raised 01/01/2014  . GERD (gastroesophageal reflux disease) 01/01/2014  . Mixed hyperlipidemia 01/01/2014    Past Surgical History:  Procedure Laterality Date  . BILATERAL CARPAL TUNNEL RELEASE     right 06/04/2006 and left 08/15/2007  . COLONOSCOPY     . HARDWARE REMOVAL Left 08/08/1974   removal of plate, left radius, bone graft, left ulna from left ilium  . HEMICOLECTOMY Left 1992   due to diverticulitis  . JOINT REPLACEMENT    . ORIF DISTAL RADIUS FRACTURE Left 08/31/1973   compression plate and small bone plate  . POLYPECTOMY    . REVISION TOTAL HIP ARTHROPLASTY Left 1997  . TOTAL HIP ARTHROPLASTY Left 1992  . TOTAL HIP ARTHROPLASTY Right 2003  . TOTAL HIP REVISION Left 07/14/2017   Procedure: TOTAL HIP REVISION;  Surgeon: Dereck Leep, MD;  Location: [facility] ORS;  Service: Orthopedics;  Laterality: Left;  . UPPER GI ENDOSCOPY  2009   stretched esophagus     No current facility-administered medications for this encounter.   Current Outpatient Medications:  .  losartan-hydrochlorothiazide (HYZAAR) 50-12.5 MG tablet, Take by mouth., Disp: , Rfl:  .  pravastatin (PRAVACHOL) 40 MG tablet, Take by mouth., Disp: , Rfl:  .  albuterol (PROVENTIL HFA;VENTOLIN HFA) 108 (90 Base) MCG/ACT inhaler, Inhale 2 puffs into the lungs every 4 (four) hours as needed for wheezing., Disp: 1 Inhaler, Rfl: 0 .  benzonatate (TESSALON PERLES) 100 MG capsule, Take 1 capsule (100 mg total) by mouth 3 (three) times daily as needed for cough., Disp: 15 capsule, Rfl: 0 .  chlorpheniramine-HYDROcodone (TUSSIONEX PENNKINETIC ER) 10-8 MG/5ML SUER, Take 3 mLs by mouth at bedtime as needed for cough. do not drive or operate machinery while taking as can cause drowsiness., Disp: 30 mL, Rfl: 0 .  doxycycline (VIBRAMYCIN) 100 MG capsule, Take 1 capsule (100 mg total) by mouth 2 (two) times  daily., Disp: 20 capsule, Rfl: 0 .  fluticasone (FLONASE) 50 MCG/ACT nasal spray, Place 1 spray into both nostrils as needed for allergies or rhinitis., Disp: , Rfl:  .  GNP GARLIC EXTRACT PO, Take 2 capsules by mouth daily., Disp: , Rfl:  .  LACTOBACILLUS BIFIDUS PO, Take 1 tablet by mouth daily., Disp: , Rfl:  .  Magnesium Oxide 500 MG TABS, Take 1 tablet by mouth daily., Disp: ,  Rfl:  .  montelukast (SINGULAIR) 10 MG tablet, Take 10 mg by mouth daily as needed., Disp: , Rfl:  .  Multiple Vitamins-Minerals (EQ COMPLETE MULTIVITAMIN-ADULT PO), Take 1 tablet by mouth daily., Disp: , Rfl:  .  Omega-3 Fatty Acids (OMEGA-3 FISH OIL PO), Take 1,040 mg by mouth daily., Disp: , Rfl:  .  pantoprazole (PROTONIX) 40 MG tablet, Take 1 tablet by mouth daily., Disp: , Rfl:  .  potassium chloride (K-DUR,KLOR-CON) 10 MEQ tablet, Take 1 tablet by mouth daily., Disp: , Rfl:   Allergies Sulfa antibiotics; Lisinopril; and Citalopram  Family History  Problem Relation Age of Onset  . Hypertension Mother   . Alzheimer's disease Mother   . Diabetes Father   . Hypertension Father   . Stroke Father   . Breast cancer Cousin 3       mat cousin  . Breast cancer Other 30  . Lung cancer Brother   . Cancer Brother     Social History Social History   Tobacco Use  . Smoking status: Never Smoker  . Smokeless tobacco: Never Used  Substance Use Topics  . Alcohol use: Yes    Comment: ocassional wine  . Drug use: No    Review of Systems Constitutional: No fever in the last few days. ENT: No sore throat.  Positive congestion. Cardiovascular: Denies chest pain. Respiratory: Denies shortness of breath. Gastrointestinal: No abdominal pain. Musculoskeletal: Negative for back pain. Skin: Negative for rash.   ____________________________________________   PHYSICAL EXAM:  VITAL SIGNS: ED Triage Vitals  Enc Vitals Group     BP 04/10/18 0903 139/63     Pulse Rate 04/10/18 0903 89     Resp 04/10/18 0903 17     Temp 04/10/18 0903 98 F (36.7 C)     Temp Source 04/10/18 0903 Oral     SpO2 04/10/18 0903 100 %     Weight 04/10/18 0905 190 lb (86.2 kg)     Height 04/10/18 0905 5\' 1"  (1.549 m)     Head Circumference --      Peak Flow --      Pain Score 04/10/18 0905 0     Pain Loc --      Pain Edu? --      Excl. in [place]? --    Constitutional: Alert and oriented. Well appearing  and in no acute distress. Eyes: Conjunctivae are normal.  Head: Atraumatic. No sinus tenderness to palpation. No swelling. No erythema.  Ears: no erythema, normal TMs bilaterally.   Nose:Nasal congestion   Mouth/Throat: Mucous membranes are moist. No pharyngeal erythema. No tonsillar swelling or exudate.  Neck: No stridor.  No cervical spine tenderness to palpation. Hematological/Lymphatic/Immunilogical: No cervical lymphadenopathy. Cardiovascular: Normal rate, regular rhythm. Grossly normal heart sounds.  Good peripheral circulation. Respiratory: Normal respiratory effort.  No retractions. No wheezes, rales or rhonchi. Good air movement.  Dry intermittent cough in room. Musculoskeletal: Ambulatory with steady gait.  No lower extremity edema noted bilaterally. Neurologic:  Normal speech and language. No gait instability. Skin:  Skin appears warm, dry and intact. No rash noted. Psychiatric: Mood and affect are normal. Speech and behavior are normal. ___________________________________________   LABS (all labs ordered are listed, but only abnormal results are displayed)  Labs Reviewed - No data to display   PROCEDURES Procedures   INITIAL IMPRESSION / ASSESSMENT AND PLAN / ED COURSE  Pertinent labs & imaging results that were available during my care of the patient were reviewed by me and considered in my medical decision making (see chart for details).  Well-appearing patient.  No acute distress.  Suspect recent viral upper respiratory infection with now bronchitis.  As patient does have comorbidities, will empirically treat with oral doxycycline, PRN Tessalon Perles and PRN Tussionex.  Albuterol inhaler given as needed.Discussed indication, risks and benefits of medications with patient.  Discussed follow up with Primary care physician this week. Discussed follow up and return parameters including no resolution or any worsening concerns. Patient verbalized understanding and agreed to  plan.   ____________________________________________   FINAL CLINICAL IMPRESSION(S) / ED DIAGNOSES  Final diagnoses:  Acute bronchitis, unspecified organism     ED Discharge Orders         Ordered    benzonatate (TESSALON PERLES) 100 MG capsule  3 times daily PRN     04/10/18 0948    doxycycline (VIBRAMYCIN) 100 MG capsule  2 times daily     04/10/18 0948    albuterol (PROVENTIL HFA;VENTOLIN HFA) 108 (90 Base) MCG/ACT inhaler  Every 4 hours PRN     04/10/18 0948    chlorpheniramine-HYDROcodone (TUSSIONEX PENNKINETIC ER) 10-8 MG/5ML SUER  At bedtime PRN     04/10/18 0949           Note: This dictation was prepared with Dragon dictation along with smaller phrase technology. Any transcriptional errors that result from this process are unintentional.         Marylene Land, NP 04/10/18 1028

## 2018-04-10 NOTE — Discharge Instructions (Signed)
Take medication as prescribed. Rest. Drink plenty of fluids.  ° °Follow up with your primary care physician this week as needed. Return to Urgent care for new or worsening concerns.  ° °

## 2018-08-19 ENCOUNTER — Other Ambulatory Visit: Payer: Self-pay

## 2018-08-19 ENCOUNTER — Other Ambulatory Visit
Admission: RE | Admit: 2018-08-19 | Discharge: 2018-08-19 | Disposition: A | Discharge status: Home / Self Care | Payer: Medicare HMO | Source: Ambulatory Visit | Attending: Internal Medicine | Admitting: Internal Medicine

## 2018-08-19 DIAGNOSIS — Z1159 Encounter for screening for other viral diseases: Secondary | ICD-10-CM | POA: Insufficient documentation

## 2018-08-20 LAB — NOVEL CORONAVIRUS, NAA (HOSP ORDER, SEND-OUT TO REF LAB; TAT 18-24 HRS): SARS-CoV-2, NAA: NOT DETECTED

## 2018-08-23 ENCOUNTER — Encounter: Payer: Self-pay | Admitting: *Deleted

## 2018-08-24 ENCOUNTER — Other Ambulatory Visit: Payer: Self-pay

## 2018-08-24 ENCOUNTER — Ambulatory Visit
Admission: RE | Admit: 2018-08-24 | Discharge: 2018-08-24 | Disposition: A | Discharge status: Home / Self Care | Payer: Medicare HMO | Attending: Internal Medicine | Admitting: Internal Medicine

## 2018-08-24 ENCOUNTER — Ambulatory Visit: Payer: Medicare HMO | Admitting: Anesthesiology

## 2018-08-24 ENCOUNTER — Encounter
Admission: RE | Disposition: A | Discharge status: Home / Self Care | Payer: Self-pay | Source: Home / Self Care | Attending: Internal Medicine

## 2018-08-24 ENCOUNTER — Encounter: Payer: Self-pay | Admitting: *Deleted

## 2018-08-24 DIAGNOSIS — K5790 Diverticulosis of intestine, part unspecified, without perforation or abscess without bleeding: Secondary | ICD-10-CM | POA: Diagnosis not present

## 2018-08-24 DIAGNOSIS — E669 Obesity, unspecified: Secondary | ICD-10-CM | POA: Insufficient documentation

## 2018-08-24 DIAGNOSIS — D123 Benign neoplasm of transverse colon: Secondary | ICD-10-CM | POA: Diagnosis not present

## 2018-08-24 DIAGNOSIS — K589 Irritable bowel syndrome without diarrhea: Secondary | ICD-10-CM | POA: Diagnosis not present

## 2018-08-24 DIAGNOSIS — K219 Gastro-esophageal reflux disease without esophagitis: Secondary | ICD-10-CM | POA: Diagnosis not present

## 2018-08-24 DIAGNOSIS — Z79899 Other long term (current) drug therapy: Secondary | ICD-10-CM | POA: Diagnosis not present

## 2018-08-24 DIAGNOSIS — E782 Mixed hyperlipidemia: Secondary | ICD-10-CM | POA: Insufficient documentation

## 2018-08-24 DIAGNOSIS — Z1211 Encounter for screening for malignant neoplasm of colon: Secondary | ICD-10-CM | POA: Insufficient documentation

## 2018-08-24 DIAGNOSIS — J302 Other seasonal allergic rhinitis: Secondary | ICD-10-CM | POA: Insufficient documentation

## 2018-08-24 DIAGNOSIS — I10 Essential (primary) hypertension: Secondary | ICD-10-CM | POA: Insufficient documentation

## 2018-08-24 DIAGNOSIS — Z8371 Family history of colonic polyps: Secondary | ICD-10-CM | POA: Diagnosis not present

## 2018-08-24 DIAGNOSIS — K640 First degree hemorrhoids: Secondary | ICD-10-CM | POA: Diagnosis not present

## 2018-08-24 DIAGNOSIS — Z6835 Body mass index (BMI) 35.0-35.9, adult: Secondary | ICD-10-CM | POA: Diagnosis not present

## 2018-08-24 HISTORY — DX: Mixed hyperlipidemia: E78.2

## 2018-08-24 HISTORY — DX: Malabsorption due to intolerance, not elsewhere classified: K90.49

## 2018-08-24 HISTORY — DX: Eosinophilia, unspecified: D72.10

## 2018-08-24 HISTORY — DX: Other long term (current) drug therapy: Z79.899

## 2018-08-24 HISTORY — PX: COLONOSCOPY WITH PROPOFOL: SHX5780

## 2018-08-24 HISTORY — DX: Obesity, unspecified: E66.9

## 2018-08-24 SURGERY — COLONOSCOPY WITH PROPOFOL
Anesthesia: General

## 2018-08-24 MED ORDER — PROPOFOL 10 MG/ML IV BOLUS
INTRAVENOUS | Status: AC
  Filled 2018-08-24: qty 20

## 2018-08-24 MED ORDER — PROPOFOL 500 MG/50ML IV EMUL
INTRAVENOUS | Status: DC | PRN
  Administered 2018-08-24: 160 ug/kg/min via INTRAVENOUS

## 2018-08-24 MED ORDER — PROPOFOL 10 MG/ML IV BOLUS
INTRAVENOUS | Status: DC | PRN
  Administered 2018-08-24: 80 mg via INTRAVENOUS

## 2018-08-24 MED ORDER — SODIUM CHLORIDE 0.9 % IV SOLN
INTRAVENOUS | Status: DC
  Administered 2018-08-24: 13:00:00 via INTRAVENOUS

## 2018-08-24 NOTE — Anesthesia Preprocedure Evaluation (Signed)
Anesthesia Evaluation  Patient identified by MRN, date of birth, ID band Patient awake    Reviewed: Allergy & Precautions, NPO status , Patient's Chart, lab work & pertinent test results, reviewed documented beta blocker date and time   Airway Mallampati: III  TM Distance: >3 FB     Dental  (+) Chipped, Partial Upper   Pulmonary           Cardiovascular hypertension, Pt. on medications + dysrhythmias      Neuro/Psych    GI/Hepatic GERD  Controlled,  Endo/Other    Renal/GU      Musculoskeletal  (+) Arthritis ,   Abdominal   Peds  Hematology  (+) anemia ,   Anesthesia Other Findings EKG shows Lbbb, which she has had for a long time. Sees Dr. Ubaldo Glassing on occasion.  Reproductive/Obstetrics                             Anesthesia Physical Anesthesia Plan  ASA: III  Anesthesia Plan: General   Post-op Pain Management:    Induction: Intravenous  PONV Risk Score and Plan:   Airway Management Planned:   Additional Equipment:   Intra-op Plan:   Post-operative Plan:   Informed Consent: I have reviewed the patients History and Physical, chart, labs and discussed the procedure including the risks, benefits and alternatives for the proposed anesthesia with the patient or authorized representative who has indicated his/her understanding and acceptance.       Plan Discussed with: CRNA  Anesthesia Plan Comments:         Anesthesia Quick Evaluation

## 2018-08-24 NOTE — Anesthesia Postprocedure Evaluation (Signed)
Anesthesia Post Note  Patient: Roberta Hanson  Procedure(s) Performed: COLONOSCOPY WITH PROPOFOL (N/A )  Patient location during evaluation: Endoscopy Anesthesia Type: General Level of consciousness: awake and alert Pain management: pain level controlled Vital Signs Assessment: post-procedure vital signs reviewed and stable Respiratory status: spontaneous breathing, nonlabored ventilation, respiratory function stable and patient connected to nasal cannula oxygen Cardiovascular status: blood pressure returned to baseline and stable Postop Assessment: no apparent nausea or vomiting Anesthetic complications: no     Last Vitals:  Vitals:   08/24/18 1329 08/24/18 1339  BP: 121/80 128/69  Pulse: 67 70  Resp: 12 (!) 21  Temp:    SpO2: 100% 100%    Last Pain:  Vitals:   08/24/18 1339  TempSrc:   PainSc: 0-No pain                 Chrishaun Sasso S

## 2018-08-24 NOTE — Interval H&P Note (Signed)
History and Physical Interval Note:  08/24/2018 12:13 PM  Roberta Hanson  has presented today for surgery, with the diagnosis of FAMILY HX.OF COLON POLYPS.  The various methods of treatment have been discussed with the patient and family. After consideration of risks, benefits and other options for treatment, the patient has consented to  Procedure(s): COLONOSCOPY WITH PROPOFOL (N/A) as a surgical intervention.  The patient's history has been reviewed, patient examined, no change in status, stable for surgery.  I have reviewed the patient's chart and labs.  Questions were answered to the patient's satisfaction.     [place], [place]

## 2018-08-24 NOTE — Transfer of Care (Signed)
Immediate Anesthesia Transfer of Care Note  Patient: Roberta Hanson  Procedure(s) Performed: COLONOSCOPY WITH PROPOFOL (N/A )  Patient Location: PACU  Anesthesia Type:General  Level of Consciousness: drowsy  Airway & Oxygen Therapy: Patient Spontanous Breathing and Patient connected to nasal cannula oxygen  Post-op Assessment: Report given to RN and Post -op Vital signs reviewed and stable  Post vital signs: Reviewed and stable  Last Vitals:  Vitals Value Taken Time  BP 113/56 08/24/18 1309  Temp 36 C 08/24/18 1309  Pulse 75 08/24/18 1309  Resp 16 08/24/18 1309  SpO2 98 % 08/24/18 1309    Last Pain:  Vitals:   08/24/18 1309  TempSrc: Tympanic  PainSc: Asleep         Complications: No apparent anesthesia complications

## 2018-08-24 NOTE — Anesthesia Post-op Follow-up Note (Signed)
Anesthesia QCDR form completed.        

## 2018-08-24 NOTE — H&P (Signed)
Outpatient short stay form Pre-procedure 08/24/2018 12:12 PM Teodoro K. Alice Reichert, M.D.  Primary Physician: Ezequiel Kayser, M.D.  Reason for visit: Family history of colon polyps.  History of present illness:   [name] is a 78 year old patient presenting for family history of colon polyps in two first degree relatives. Patient denies any change in bowel habits, rectal bleeding or involuntary weight loss.    Current Facility-Administered Medications:  .  0.9 %  sodium chloride infusion, , Intravenous, Continuous, Toledo, Benay Pike, MD  Medications Prior to Admission  Medication Sig Dispense Refill Last Dose  . acetaminophen (TYLENOL 8 HOUR) 650 MG CR tablet Take 650 mg by mouth every 8 (eight) hours as needed for pain.   Past Week at Unknown time  . fexofenadine (ALLEGRA) 180 MG tablet Take 180 mg by mouth daily.   Past Month at Unknown time  . fluticasone (FLONASE) 50 MCG/ACT nasal spray Place 1 spray into both nostrils as needed for allergies or rhinitis.   Past Week at Unknown time  . GNP GARLIC EXTRACT PO Take 2 capsules by mouth daily.   Past Week at Unknown time  . LACTOBACILLUS BIFIDUS PO Take 1 tablet by mouth daily.   Past Week at Unknown time  . losartan-hydrochlorothiazide (HYZAAR) 50-12.5 MG tablet Take by mouth.   08/24/2018 at 0800  . Magnesium Oxide 500 MG TABS Take 1 tablet by mouth daily.   Past Month at Unknown time  . montelukast (SINGULAIR) 10 MG tablet Take 10 mg by mouth daily as needed.   Past Week at Unknown time  . Multiple Vitamins-Minerals (EQ COMPLETE MULTIVITAMIN-ADULT PO) Take 1 tablet by mouth daily.   Past Week at Unknown time  . Omega-3 Fatty Acids (OMEGA-3 FISH OIL PO) Take 1,040 mg by mouth daily.   Past Week at Unknown time  . pantoprazole (PROTONIX) 40 MG tablet Take 1 tablet by mouth daily.   Past Week at Unknown time  . pravastatin (PRAVACHOL) 40 MG tablet Take by mouth.   Past Week at Unknown time  . albuterol (PROVENTIL HFA;VENTOLIN HFA) 108 (90  Base) MCG/ACT inhaler Inhale 2 puffs into the lungs every 4 (four) hours as needed for wheezing. (Patient not taking: Reported on 08/24/2018) 1 Inhaler 0 Not Taking at Unknown time  . benzonatate (TESSALON PERLES) 100 MG capsule Take 1 capsule (100 mg total) by mouth 3 (three) times daily as needed for cough. (Patient not taking: Reported on 08/24/2018) 15 capsule 0 Not Taking at Unknown time  . chlorpheniramine-HYDROcodone (TUSSIONEX PENNKINETIC ER) 10-8 MG/5ML SUER Take 3 mLs by mouth at bedtime as needed for cough. do not drive or operate machinery while taking as can cause drowsiness. (Patient not taking: Reported on 08/24/2018) 30 mL 0 Not Taking at Unknown time  . clidinium-chlordiazePOXIDE (LIBRAX) 5-2.5 MG capsule Take 1 capsule by mouth 4 (four) times daily -  before meals and at bedtime.   Not Taking at Unknown time  . doxycycline (VIBRAMYCIN) 100 MG capsule Take 1 capsule (100 mg total) by mouth 2 (two) times daily. (Patient not taking: Reported on 08/24/2018) 20 capsule 0 Not Taking at Unknown time  . potassium chloride (K-DUR,KLOR-CON) 10 MEQ tablet Take 1 tablet by mouth daily.        Allergies  Allergen Reactions  . Sulfa Antibiotics Rash  . Lisinopril Cough  . Citalopram Other (See Comments)    Feels aggitated     Past Medical History:  Diagnosis Date  . Anemia   .  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 Colon polyp   . Degenerative disc disease, cervical 12/2013  . Degenerative disc disease, cervical   . Diverticulitis   . Diverticulosis   . Eosinophilia   . Food intolerance   . GERD (gastroesophageal reflux disease) 01/01/2014  . High risk medication use   . Hyperlipidemia, mixed   . Hypertension   . Irritable bowel syndrome   . Irritable bowel syndrome   . Mixed hyperlipidemia   . Obesity (BMI 35.0-39.9 without comorbidity)   . Osteoarthritis 01/01/2014  . Seasonal allergies 01/01/2014    Review of systems:  Otherwise negative.    Physical Exam  Gen:  Alert, oriented. Appears stated age.  HEENT: [place]/AT. PERRLA. Lungs: CTA, no wheezes. CV: RR nl S1, S2. Abd: soft, benign, no masses. BS+ Ext: No edema. Pulses 2+    Planned procedures: Proceed with colonoscopy. The patient understands the nature of the planned procedure, indications, risks, alternatives and potential complications including but not limited to bleeding, infection, perforation, damage to internal organs and possible oversedation/side effects from anesthesia. The patient agrees and gives consent to proceed.  Please refer to procedure notes for findings, recommendations and patient disposition/instructions.     Teodoro K. Alice Reichert, M.D. Gastroenterology 08/24/2018  12:12 PM

## 2018-08-24 NOTE — Op Note (Signed)
[facility] Gastroenterology Patient Name: Roberta Hanson Procedure Date: 08/24/2018 11:57 AM MRN: [id number] Account #: [id number] Date of Birth: Jan 30, 1941 Admit Type: Outpatient Age: 78 Room: [facility] ENDO ROOM 3 Gender: Female Note Status: Finalized Procedure:            Colonoscopy Indications:          Colon cancer screening in patient at increased risk:                        Family history of 1st-degree relative with colon polyps Providers:            Benay Pike. Nixon Kolton MD, MD Medicines:            Propofol per Anesthesia Complications:        No immediate complications. Procedure:            Pre-Anesthesia Assessment:                       - The risks and benefits of the procedure and the                        sedation options and risks were discussed with the                        patient. All questions were answered and informed                        consent was obtained.                       - Patient identification and proposed procedure were                        verified prior to the procedure by the nurse. The                        procedure was verified in the procedure room.                       - ASA Grade Assessment: III - A patient with severe                        systemic disease.                       - After reviewing the risks and benefits, the patient                        was deemed in satisfactory condition to undergo the                        procedure.                       After obtaining informed consent, the colonoscope was                        passed under direct vision. Throughout the procedure,                        the patient's blood pressure, pulse, and oxygen  saturations were monitored continuously. The                        Colonoscope was introduced through the anus and                        advanced to the the cecum, identified by appendiceal                        orifice and ileocecal  valve. The colonoscopy was                        performed without difficulty. The patient tolerated the                        procedure well. The quality of the bowel preparation                        was adequate. The ileocecal valve, appendiceal orifice,                        and rectum were photographed. Findings:      The perianal and digital rectal examinations were normal. Pertinent       negatives include normal sphincter tone and no palpable rectal lesions.      Multiple small and large-mouthed diverticula were found in the entire       colon. There was no evidence of diverticular bleeding.      A 8 mm polyp was found in the transverse colon. The polyp was sessile.       The polyp was removed with a hot snare. Resection and retrieval were       complete.      Non-bleeding internal hemorrhoids were found during retroflexion. The       hemorrhoids were Grade I (internal hemorrhoids that do not prolapse).      The exam was otherwise without abnormality. Impression:           - Moderate diverticulosis in the entire examined colon.                        There was no evidence of diverticular bleeding.                       - One 8 mm polyp in the transverse colon, removed with                        a hot snare. Resected and retrieved.                       - Non-bleeding internal hemorrhoids.                       - The examination was otherwise normal. Recommendation:       - Patient has a contact number available for                        emergencies. The signs and symptoms of potential                        delayed complications were discussed with the patient.  Return to normal activities tomorrow. Written discharge                        instructions were provided to the patient.                       - Resume previous diet.                       - Continue present medications.                       - Await pathology results.                       -  No repeat colonoscopy due to age.                       - Return to GI office PRN.                       - The findings and recommendations were discussed with                        the patient. Procedure Code(s):    --- Professional ---                       [phone], Colonoscopy, flexible; with removal of tumor(s),                        polyp(s), or other lesion(s) by snare technique Diagnosis Code(s):    --- Professional ---                       K57.30, Diverticulosis of large intestine without                        perforation or abscess without bleeding                       K63.5, Polyp of colon                       K64.0, First degree hemorrhoids                       Z83.71, Family history of colonic polyps CPT copyright 2019 [employer]. All rights reserved. The codes documented in this report are preliminary and upon coder review may  be revised to meet current compliance requirements. Efrain Sella MD, MD 08/24/2018 1:09:07 PM This report has been signed electronically. Number of Addenda: 0 Note Initiated On: 08/24/2018 11:57 AM Scope Withdrawal Time: 0 hours 9 minutes 56 seconds  Total Procedure Duration: 0 hours 11 minutes 41 seconds  Estimated Blood Loss: Estimated blood loss: none.      [facility]

## 2018-08-25 ENCOUNTER — Encounter: Payer: Self-pay | Admitting: Internal Medicine

## 2018-08-26 LAB — SURGICAL PATHOLOGY

## 2019-05-19 DIAGNOSIS — M1711 Unilateral primary osteoarthritis, right knee: Secondary | ICD-10-CM | POA: Diagnosis not present

## 2019-05-19 DIAGNOSIS — M546 Pain in thoracic spine: Secondary | ICD-10-CM | POA: Diagnosis not present

## 2019-05-19 DIAGNOSIS — K219 Gastro-esophageal reflux disease without esophagitis: Secondary | ICD-10-CM | POA: Diagnosis not present

## 2019-05-23 DIAGNOSIS — E669 Obesity, unspecified: Secondary | ICD-10-CM | POA: Diagnosis not present

## 2019-05-23 DIAGNOSIS — M1711 Unilateral primary osteoarthritis, right knee: Secondary | ICD-10-CM | POA: Diagnosis not present

## 2019-06-29 DIAGNOSIS — H1045 Other chronic allergic conjunctivitis: Secondary | ICD-10-CM | POA: Diagnosis not present

## 2019-06-29 DIAGNOSIS — Z0100 Encounter for examination of eyes and vision without abnormal findings: Secondary | ICD-10-CM | POA: Diagnosis not present

## 2019-06-29 DIAGNOSIS — H2513 Age-related nuclear cataract, bilateral: Secondary | ICD-10-CM | POA: Diagnosis not present

## 2019-07-17 DIAGNOSIS — T502X5A Adverse effect of carbonic-anhydrase inhibitors, benzothiadiazides and other diuretics, initial encounter: Secondary | ICD-10-CM | POA: Diagnosis not present

## 2019-07-17 DIAGNOSIS — Z79899 Other long term (current) drug therapy: Secondary | ICD-10-CM | POA: Diagnosis not present

## 2019-07-17 DIAGNOSIS — J301 Allergic rhinitis due to pollen: Secondary | ICD-10-CM | POA: Diagnosis not present

## 2019-07-17 DIAGNOSIS — M1711 Unilateral primary osteoarthritis, right knee: Secondary | ICD-10-CM | POA: Diagnosis not present

## 2019-07-17 DIAGNOSIS — I447 Left bundle-branch block, unspecified: Secondary | ICD-10-CM | POA: Diagnosis not present

## 2019-07-17 DIAGNOSIS — K589 Irritable bowel syndrome without diarrhea: Secondary | ICD-10-CM | POA: Diagnosis not present

## 2019-07-17 DIAGNOSIS — E876 Hypokalemia: Secondary | ICD-10-CM | POA: Diagnosis not present

## 2019-07-17 DIAGNOSIS — E782 Mixed hyperlipidemia: Secondary | ICD-10-CM | POA: Diagnosis not present

## 2019-07-17 DIAGNOSIS — K219 Gastro-esophageal reflux disease without esophagitis: Secondary | ICD-10-CM | POA: Diagnosis not present

## 2019-07-17 DIAGNOSIS — I10 Essential (primary) hypertension: Secondary | ICD-10-CM | POA: Diagnosis not present

## 2019-07-17 DIAGNOSIS — Z0000 Encounter for general adult medical examination without abnormal findings: Secondary | ICD-10-CM | POA: Diagnosis not present

## 2019-07-27 DIAGNOSIS — Z96649 Presence of unspecified artificial hip joint: Secondary | ICD-10-CM | POA: Diagnosis not present

## 2019-07-27 DIAGNOSIS — Z96642 Presence of left artificial hip joint: Secondary | ICD-10-CM | POA: Diagnosis not present

## 2019-11-23 DIAGNOSIS — M1711 Unilateral primary osteoarthritis, right knee: Secondary | ICD-10-CM | POA: Diagnosis not present

## 2019-11-30 DIAGNOSIS — Z23 Encounter for immunization: Secondary | ICD-10-CM | POA: Diagnosis not present

## 2019-11-30 DIAGNOSIS — M1711 Unilateral primary osteoarthritis, right knee: Secondary | ICD-10-CM | POA: Diagnosis not present

## 2019-12-07 DIAGNOSIS — M1711 Unilateral primary osteoarthritis, right knee: Secondary | ICD-10-CM | POA: Diagnosis not present

## 2020-01-11 DIAGNOSIS — M65321 Trigger finger, right index finger: Secondary | ICD-10-CM | POA: Diagnosis not present

## 2020-01-11 DIAGNOSIS — E669 Obesity, unspecified: Secondary | ICD-10-CM | POA: Diagnosis not present

## 2020-02-07 DIAGNOSIS — F419 Anxiety disorder, unspecified: Secondary | ICD-10-CM | POA: Diagnosis not present

## 2020-02-07 DIAGNOSIS — R399 Unspecified symptoms and signs involving the genitourinary system: Secondary | ICD-10-CM | POA: Diagnosis not present

## 2020-02-07 DIAGNOSIS — I10 Essential (primary) hypertension: Secondary | ICD-10-CM | POA: Diagnosis not present

## 2020-02-07 DIAGNOSIS — N760 Acute vaginitis: Secondary | ICD-10-CM | POA: Diagnosis not present

## 2020-02-08 ENCOUNTER — Emergency Department
Admission: EM | Admit: 2020-02-08 | Discharge: 2020-02-08 | Disposition: A | Discharge status: Home / Self Care | Payer: Medicare HMO | Attending: Emergency Medicine | Admitting: Emergency Medicine

## 2020-02-08 ENCOUNTER — Encounter: Payer: Self-pay | Admitting: Emergency Medicine

## 2020-02-08 ENCOUNTER — Emergency Department: Payer: Medicare HMO

## 2020-02-08 ENCOUNTER — Other Ambulatory Visit: Payer: Self-pay

## 2020-02-08 DIAGNOSIS — I10 Essential (primary) hypertension: Secondary | ICD-10-CM | POA: Diagnosis not present

## 2020-02-08 DIAGNOSIS — Z79899 Other long term (current) drug therapy: Secondary | ICD-10-CM | POA: Diagnosis not present

## 2020-02-08 DIAGNOSIS — R079 Chest pain, unspecified: Secondary | ICD-10-CM | POA: Diagnosis not present

## 2020-02-08 DIAGNOSIS — Z96659 Presence of unspecified artificial knee joint: Secondary | ICD-10-CM | POA: Diagnosis not present

## 2020-02-08 DIAGNOSIS — R0789 Other chest pain: Secondary | ICD-10-CM | POA: Diagnosis not present

## 2020-02-08 DIAGNOSIS — R002 Palpitations: Secondary | ICD-10-CM | POA: Diagnosis not present

## 2020-02-08 DIAGNOSIS — R202 Paresthesia of skin: Secondary | ICD-10-CM | POA: Diagnosis not present

## 2020-02-08 DIAGNOSIS — R918 Other nonspecific abnormal finding of lung field: Secondary | ICD-10-CM | POA: Diagnosis not present

## 2020-02-08 DIAGNOSIS — R451 Restlessness and agitation: Secondary | ICD-10-CM | POA: Insufficient documentation

## 2020-02-08 DIAGNOSIS — J9811 Atelectasis: Secondary | ICD-10-CM | POA: Diagnosis not present

## 2020-02-08 LAB — CBC
HCT: 40.8 % (ref 36.0–46.0)
Hemoglobin: 12.5 g/dL (ref 12.0–15.0)
MCH: 23.1 pg — ABNORMAL LOW (ref 26.0–34.0)
MCHC: 30.6 g/dL (ref 30.0–36.0)
MCV: 75.6 fL — ABNORMAL LOW (ref 80.0–100.0)
Platelets: 223 10*3/uL (ref 150–400)
RBC: 5.4 MIL/uL — ABNORMAL HIGH (ref 3.87–5.11)
RDW: 14.9 % (ref 11.5–15.5)
WBC: 6.9 10*3/uL (ref 4.0–10.5)
nRBC: 0 % (ref 0.0–0.2)

## 2020-02-08 LAB — BASIC METABOLIC PANEL
Anion gap: 9 (ref 5–15)
BUN: 8 mg/dL (ref 8–23)
CO2: 27 mmol/L (ref 22–32)
Calcium: 9.4 mg/dL (ref 8.9–10.3)
Chloride: 102 mmol/L (ref 98–111)
Creatinine, Ser: 0.67 mg/dL (ref 0.44–1.00)
GFR, Estimated: >60 mL/min (ref >60)
Glucose, Bld: 109 mg/dL — ABNORMAL HIGH (ref 70–99)
Potassium: 3.4 mmol/L — ABNORMAL LOW (ref 3.5–5.1)
Sodium: 138 mmol/L (ref 135–145)

## 2020-02-08 LAB — URINALYSIS, COMPLETE (UACMP) WITH MICROSCOPIC
Bacteria, UA: NONE SEEN
Bilirubin Urine: NEGATIVE
Glucose, UA: NEGATIVE mg/dL
Ketones, ur: NEGATIVE mg/dL
Leukocytes,Ua: NEGATIVE
Nitrite: NEGATIVE
Protein, ur: NEGATIVE mg/dL
Specific Gravity, Urine: 1.004 — ABNORMAL LOW (ref 1.005–1.030)
Squamous Epithelial / LPF: NONE SEEN (ref 0–5)
pH: 7 (ref 5.0–8.0)

## 2020-02-08 LAB — TROPONIN I (HIGH SENSITIVITY): Troponin I (High Sensitivity): 7 ng/L (ref <18)

## 2020-02-08 NOTE — ED Notes (Signed)
Patient denies pain and is resting comfortably.  

## 2020-02-08 NOTE — ED Triage Notes (Signed)
Pt states she has had weakness and "agitation in my heart" x 1-2 weeks. Pt states she has this mostly at night and that she had numbness down her entire left side a few nights ago, and that has returned several times since. Pt states she can feel heart palpitations as well. Denies sob, emesis, diaphoresis. Pt alert & oriented, nad noted.

## 2020-02-08 NOTE — ED Provider Notes (Signed)
[facility] Emergency Department Provider Note   ____________________________________________   Event Date/Time   First MD Initiated Contact with Patient 02/08/20 1503     (approximate)  I have reviewed the triage vital signs and the nursing notes.   HISTORY  Chief Complaint Weakness and Numbness    HPI Roberta Hanson is a 79 y.o. female with past medical history of hypertension, hyperlipidemia, GERD, and anxiety presents to the ED complaining of palpitations and numbness.  Patient reports that she has been having intermittent episodes for the past 2 weeks where she feels like her heart is racing and there is "agitation" in her chest.  She denies any associated fevers, cough, or shortness of breath.  She does state that she will have numbness and tingling in certain parts of her body when these episodes come on, particularly in the left side.  She has not had any vision changes, speech changes, or weakness associated with the episodes.  She endorses urinary frequency, but has not had any dysuria or hematuria.  She denies any abdominal pain, vomiting, or diarrhea.  She spoke with her PCPs office about the symptoms earlier today and was referred to the ED for further evaluation.        Past Medical History:  Diagnosis Date  . Anemia   . 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 Colon polyp   . Degenerative disc disease, cervical 12/2013  . Degenerative disc disease, cervical   . Diverticulitis   . Diverticulosis   . Eosinophilia   . Food intolerance   . GERD (gastroesophageal reflux disease) 01/01/2014  . High risk medication use   . Hyperlipidemia, mixed   . Hypertension   . Irritable bowel syndrome   . Irritable bowel syndrome   . Mixed hyperlipidemia   . Obesity (BMI 35.0-39.9 without comorbidity)   . Osteoarthritis 01/01/2014  . Seasonal allergies 01/01/2014    Patient Active Problem List   Diagnosis Date Noted  . 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 Primary osteoarthritis of right knee 12/23/2016  . 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 Irritable bowel syndrome 07/04/2015  . Tinnitus, bilateral 01/02/2015  . Benign essential hypertension 01/01/2014  . Degenerative disc disease, cervical 01/01/2014  . Eosinophil count raised 01/01/2014  . GERD (gastroesophageal reflux disease) 01/01/2014  . Mixed hyperlipidemia 01/01/2014    Past Surgical History:  Procedure Laterality Date  . BILATERAL CARPAL TUNNEL RELEASE     right 06/04/2006 and left 08/15/2007  . COLON SURGERY    . COLONOSCOPY    . COLONOSCOPY WITH PROPOFOL N/A 08/24/2018   Procedure: COLONOSCOPY WITH PROPOFOL;  Surgeon: Toledo, Benay Pike, MD;  Location: [facility] ENDOSCOPY;  Service: Gastroenterology;  Laterality: N/A;  . ESOPHAGOGASTRODUODENOSCOPY    . HARDWARE REMOVAL Left 08/08/1974   removal of plate, left radius, bone graft, left ulna from left ilium  . HEMICOLECTOMY Left 1992   due to diverticulitis  . JOINT REPLACEMENT    . ORIF DISTAL RADIUS FRACTURE Left 08/31/1973   compression plate and small bone plate  . POLYPECTOMY    . REVISION TOTAL HIP ARTHROPLASTY Left 1997  . TOTAL HIP ARTHROPLASTY Left 1992  . TOTAL HIP ARTHROPLASTY Right 2003  . TOTAL HIP REVISION Left 07/14/2017   Procedure: TOTAL HIP REVISION;  Surgeon: Dereck Leep, MD;  Location: [facility] ORS;  Service: Orthopedics;  Laterality: Left;  . UPPER GI ENDOSCOPY  2009   stretched esophagus    Prior to  Admission medications   Medication Sig Start Date End Date Taking? Authorizing Provider  acetaminophen (TYLENOL 8 HOUR) 650 MG CR tablet Take 650 mg by mouth every 8 (eight) hours as needed for pain.    [provider]  albuterol (PROVENTIL HFA;VENTOLIN HFA) 108 (90 Base) MCG/ACT inhaler Inhale 2 puffs into the lungs every 4 (four) hours as needed for wheezing. Patient not taking: Reported on 08/24/2018 04/10/18   Marylene Land, NP  benzonatate (TESSALON PERLES) 100  MG capsule Take 1 capsule (100 mg total) by mouth 3 (three) times daily as needed for cough. Patient not taking: Reported on 08/24/2018 04/10/18   Marylene Land, NP  chlorpheniramine-HYDROcodone [facility] ER) 10-8 MG/5ML SUER Take 3 mLs by mouth at bedtime as needed for cough. do not drive or operate machinery while taking as can cause drowsiness. Patient not taking: Reported on 08/24/2018 04/10/18   Marylene Land, NP  clidinium-chlordiazePOXIDE (LIBRAX) 5-2.5 MG capsule Take 1 capsule by mouth 4 (four) times daily -  before meals and at bedtime.    [provider]  doxycycline (VIBRAMYCIN) 100 MG capsule Take 1 capsule (100 mg total) by mouth 2 (two) times daily. Patient not taking: Reported on 08/24/2018 04/10/18   Marylene Land, NP  fexofenadine (ALLEGRA) 180 MG tablet Take 180 mg by mouth daily.    [provider]  fluticasone (FLONASE) 50 MCG/ACT nasal spray Place 1 spray into both nostrils as needed for allergies or rhinitis.    [provider]  GNP GARLIC EXTRACT PO Take 2 capsules by mouth daily.    [provider]  LACTOBACILLUS BIFIDUS PO Take 1 tablet by mouth daily.    [provider]  losartan-hydrochlorothiazide Konrad Penta) 50-12.5 MG tablet Take by mouth. 01/12/18 01/12/19  [provider]  Magnesium Oxide 500 MG TABS Take 1 tablet by mouth daily.    [provider]  montelukast (SINGULAIR) 10 MG tablet Take 10 mg by mouth daily as needed.    [provider]  Multiple Vitamins-Minerals (EQ COMPLETE MULTIVITAMIN-ADULT PO) Take 1 tablet by mouth daily.    [provider]  Omega-3 Fatty Acids (OMEGA-3 FISH OIL PO) Take 1,040 mg by mouth daily.    [provider]  pantoprazole (PROTONIX) 40 MG tablet Take 1 tablet by mouth daily. 01/08/17   [provider]  potassium chloride (K-DUR,KLOR-CON) 10 MEQ tablet Take 1 tablet by mouth daily. 07/12/17 07/12/18  [provider]   pravastatin (PRAVACHOL) 40 MG tablet Take by mouth. 01/13/18 01/13/19  [provider]    Allergies Sulfa antibiotics, Lisinopril, and Citalopram  Family History  Problem Relation Age of Onset  . Hypertension Mother   . Alzheimer's disease Mother   . Heart attack Mother   . Diabetes Father   . Hypertension Father   . Stroke Father   . Breast cancer Cousin 4       mat cousin  . Breast cancer Other 30  . Lung cancer Brother   . Cancer Brother   . Thyroid disease Sister   . Hypertension Sister   . Colonic polyp Sister   . Colonic polyp Brother   . Diverticulitis Brother     Social History Social History   Tobacco Use  . Smoking status: Never Smoker  . Smokeless tobacco: Never Used  Vaping Use  . Vaping Use: Never used  Substance Use Topics  . Alcohol use: Not Currently  . Drug use: Never    Review of Systems  Constitutional: No  fever/chills Eyes: No visual changes. ENT: No sore throat. Cardiovascular: Positive for palpitations and chest pain. Respiratory: Denies shortness of breath. Gastrointestinal: No abdominal pain.  No nausea, no vomiting.  No diarrhea.  No constipation. Genitourinary: Negative for dysuria. Musculoskeletal: Negative for back pain. Skin: Negative for rash. Neurological: Negative for headaches, focal weakness or numbness.  Positive for tingling.  ____________________________________________   PHYSICAL EXAM:  VITAL SIGNS: ED Triage Vitals  Enc Vitals Group     BP 02/08/20 1043 (!) 141/69     Pulse Rate 02/08/20 1043 73     Resp 02/08/20 1043 20     Temp 02/08/20 1043 98.9 F (37.2 C)     Temp Source 02/08/20 1043 Oral     SpO2 02/08/20 1043 99 %     Weight 02/08/20 1041 190 lb 0.6 oz (86.2 kg)     Height 02/08/20 1041 5\' 1"  (1.549 m)     Head Circumference --      Peak Flow --      Pain Score 02/08/20 1041 0     Pain Loc --      Pain Edu? --      Excl. in [place]? --     Constitutional: Alert and oriented. Eyes:  Conjunctivae are normal. Head: Atraumatic. Nose: No congestion/rhinnorhea. Mouth/Throat: Mucous membranes are moist. Neck: Normal ROM Cardiovascular: Normal rate, regular rhythm. Grossly normal heart sounds.  2+ radial pulses bilaterally. Respiratory: Normal respiratory effort.  No retractions. Lungs CTAB. Gastrointestinal: Soft and nontender. No distention. Genitourinary: deferred Musculoskeletal: No lower extremity tenderness nor edema. Neurologic:  Normal speech and language. No gross focal neurologic deficits are appreciated. Skin:  Skin is warm, dry and intact. No rash noted. Psychiatric: Mood and affect are normal. Speech and behavior are normal.  ____________________________________________   LABS (all labs ordered are listed, but only abnormal results are displayed)  Labs Reviewed  BASIC METABOLIC PANEL - Abnormal; Notable for the following components:      Result Value   Potassium 3.4 (*)    Glucose, Bld 109 (*)    All other components within normal limits  CBC - Abnormal; Notable for the following components:   RBC 5.40 (*)    MCV 75.6 (*)    MCH 23.1 (*)    All other components within normal limits  URINALYSIS, COMPLETE (UACMP) WITH MICROSCOPIC - Abnormal; Notable for the following components:   Color, Urine YELLOW (*)    APPearance CLEAR (*)    Specific Gravity, Urine 1.004 (*)    Hgb urine dipstick MODERATE (*)    All other components within normal limits  CBG MONITORING, ED  TROPONIN I (HIGH SENSITIVITY)  TROPONIN I (HIGH SENSITIVITY)   ____________________________________________  EKG  ED ECG REPORT I, Blake Divine, the attending physician, personally viewed and interpreted this ECG.   Date: 02/08/2020  EKG Time: 10:30  Rate: 85  Rhythm: normal sinus rhythm  Axis: LAD  Intervals:left bundle branch block  ST&T Change: None   PROCEDURES  Procedure(s) performed (including Critical  Care):  Procedures   ____________________________________________   INITIAL IMPRESSION / ASSESSMENT AND PLAN / ED COURSE       79 year old female with past medical history of hypertension, hyperlipidemia, GERD, and anxiety who presents to the ED complaining of intermittent episodes of palpitations and "agitation" in her chest that is often associated with numbness and tingling in certain parts of her body.  EKG shows no evidence of arrhythmia or ischemia, left bundle branch block is similar to  previous.  She has no focal neurologic deficits on exam and states she feels back to normal at this time.  I doubt stroke or TIA as her symptoms do not fit a specific neurologic distribution.  Blood counts and electrolytes are reassuring thus far, we will also check troponin and chest x-ray.  If patient remains in symptomatic with reassuring work-up, she would be appropriate for discharge home with PCP follow-up.  Troponin is within normal limits, patient remains chest pain-free.  Chest x-ray reviewed by me and shows no infiltrate, edema, or effusion.  Patient is appropriate for discharge home with PCP follow-up, she was counseled to return to the ED for new worsening symptoms.  Patient agrees with plan.      ____________________________________________   FINAL CLINICAL IMPRESSION(S) / ED DIAGNOSES  Final diagnoses:  Palpitations  Paresthesia     ED Discharge Orders    None       Note:  This document was prepared using Dragon voice recognition software and may include unintentional dictation errors.   Blake Divine, MD 02/08/20 [phone]

## 2020-02-20 DIAGNOSIS — Z20822 Contact with and (suspected) exposure to covid-19: Secondary | ICD-10-CM | POA: Diagnosis not present

## 2020-10-07 ENCOUNTER — Emergency Department
Admission: EM | Admit: 2020-10-07 | Discharge: 2020-10-07 | Disposition: A | Discharge status: Home / Self Care | Payer: Medicare Other | Attending: Emergency Medicine | Admitting: Emergency Medicine

## 2020-10-07 ENCOUNTER — Other Ambulatory Visit: Payer: Self-pay

## 2020-10-07 ENCOUNTER — Encounter: Payer: Self-pay | Admitting: Emergency Medicine

## 2020-10-07 ENCOUNTER — Emergency Department: Payer: Medicare Other

## 2020-10-07 DIAGNOSIS — I10 Essential (primary) hypertension: Secondary | ICD-10-CM | POA: Insufficient documentation

## 2020-10-07 DIAGNOSIS — M25512 Pain in left shoulder: Secondary | ICD-10-CM | POA: Diagnosis not present

## 2020-10-07 DIAGNOSIS — Z79899 Other long term (current) drug therapy: Secondary | ICD-10-CM | POA: Insufficient documentation

## 2020-10-07 DIAGNOSIS — Z96643 Presence of artificial hip joint, bilateral: Secondary | ICD-10-CM | POA: Diagnosis not present

## 2020-10-07 DIAGNOSIS — G8929 Other chronic pain: Secondary | ICD-10-CM | POA: Insufficient documentation

## 2020-10-07 DIAGNOSIS — R531 Weakness: Secondary | ICD-10-CM | POA: Diagnosis present

## 2020-10-07 DIAGNOSIS — R350 Frequency of micturition: Secondary | ICD-10-CM | POA: Insufficient documentation

## 2020-10-07 LAB — URINALYSIS, COMPLETE (UACMP) WITH MICROSCOPIC
Bilirubin Urine: NEGATIVE
Glucose, UA: NEGATIVE mg/dL
Hgb urine dipstick: NEGATIVE
Ketones, ur: NEGATIVE mg/dL
Leukocytes,Ua: NEGATIVE
Nitrite: NEGATIVE
Protein, ur: NEGATIVE mg/dL
Specific Gravity, Urine: 1.003 — ABNORMAL LOW (ref 1.005–1.030)
Squamous Epithelial / HPF: NONE SEEN (ref 0–5)
pH: 7 (ref 5.0–8.0)

## 2020-10-07 LAB — CBC
HCT: 40.6 % (ref 36.0–46.0)
Hemoglobin: 12.8 g/dL (ref 12.0–15.0)
MCH: 24.1 pg — ABNORMAL LOW (ref 26.0–34.0)
MCHC: 31.5 g/dL (ref 30.0–36.0)
MCV: 76.3 fL — ABNORMAL LOW (ref 80.0–100.0)
Platelets: 213 10*3/uL (ref 150–400)
RBC: 5.32 MIL/uL — ABNORMAL HIGH (ref 3.87–5.11)
RDW: 15 % (ref 11.5–15.5)
WBC: 6.6 10*3/uL (ref 4.0–10.5)
nRBC: 0 % (ref 0.0–0.2)

## 2020-10-07 LAB — BASIC METABOLIC PANEL
Anion gap: 10 (ref 5–15)
BUN: 9 mg/dL (ref 8–23)
CO2: 29 mmol/L (ref 22–32)
Calcium: 10.1 mg/dL (ref 8.9–10.3)
Chloride: 98 mmol/L (ref 98–111)
Creatinine, Ser: 0.64 mg/dL (ref 0.44–1.00)
GFR, Estimated: >60 mL/min (ref >60)
Glucose, Bld: 95 mg/dL (ref 70–99)
Potassium: 3.8 mmol/L (ref 3.5–5.1)
Sodium: 137 mmol/L (ref 135–145)

## 2020-10-07 MED ORDER — MECLIZINE HCL 25 MG PO TABS
25.0000 mg | ORAL_TABLET | Freq: Once | ORAL | Status: AC
  Administered 2020-10-07: 25 mg via ORAL
  Filled 2020-10-07: qty 1

## 2020-10-07 MED ORDER — MECLIZINE HCL 25 MG PO TABS: 25.0000 mg | ORAL_TABLET | Freq: Three times a day (TID) | ORAL | 0 refills | Status: DC | PRN

## 2020-10-07 NOTE — ED Notes (Signed)
Provider at bedside

## 2020-10-07 NOTE — ED Notes (Signed)
Patient states she has not had food since this AM.  Patient provided food and fluids.

## 2020-10-07 NOTE — ED Triage Notes (Signed)
Pt states that she was at work and got home and she felt weak and had fallen hurting her left hip. Pt ambulatory at scene per EMS. She is also complaining of left shoulder weakness

## 2020-10-07 NOTE — Discharge Instructions (Addendum)
Please seek medical attention for any high fevers, chest pain, shortness of breath, change in behavior, persistent vomiting, bloody stool or any other new or concerning symptoms.  

## 2020-10-07 NOTE — ED Triage Notes (Signed)
Arrived by EMS from home for left sided hip pain that started Saturday. Denies any falls. HX 4 surgeries left hip. Ambulatory for EMS.

## 2020-10-07 NOTE — ED Notes (Signed)
Patient ambulated to and from bathroom with steady gait. Patient states she feels better and is ready to go home.   Provider aware.

## 2020-10-07 NOTE — ED Provider Notes (Signed)
[facility] Emergency Department Provider Note   ____________________________________________   I have reviewed the triage vital signs and the nursing notes.   HISTORY  Chief Complaint Weakness and Fall   History limited by: Not Limited   HPI Roberta Hanson is a 80 y.o. female who presents to the emergency department today because of concern for the sensation that her left leg has been going out on her for the past 2 days. She states that it happens occasionally and it seems to be worse when she stands up or tries to sit down, although it does not happen every time. The patient denies any numbness to her leg. Denies any back pain. Does state that she has noticed increased urgency for urination. The patient additionally has complaint of left shoulder pain. She states she has chronic left shoulder pain and it has been worse recently.  Records reviewed. Per medical record review patient has a history of HLD, HTN, hip replacement.   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   Colon polyp    Degenerative disc disease, cervical 12/2013   Degenerative disc disease, cervical    Diverticulitis    Diverticulosis    Eosinophilia    Food intolerance    GERD (gastroesophageal reflux disease) 01/01/2014   High risk medication use    Hyperlipidemia, mixed    Hypertension    Irritable bowel syndrome    Irritable bowel syndrome    Mixed hyperlipidemia    Obesity (BMI 35.0-39.9 without comorbidity)    Osteoarthritis 01/01/2014   Seasonal allergies 01/01/2014    Patient Active Problem List   Diagnosis Date Noted   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   Primary osteoarthritis of right knee 12/23/2016   Bundle branch block, 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   Irritable bowel syndrome 07/04/2015   Tinnitus, bilateral 01/02/2015   Benign essential hypertension 01/01/2014   Degenerative disc disease, cervical 01/01/2014    Eosinophil count raised 01/01/2014   GERD (gastroesophageal reflux disease) 01/01/2014   Mixed hyperlipidemia 01/01/2014    Past Surgical History:  Procedure Laterality Date   BILATERAL CARPAL TUNNEL RELEASE     right 06/04/2006 and left 08/15/2007   COLON SURGERY     COLONOSCOPY     COLONOSCOPY WITH PROPOFOL N/A 08/24/2018   Procedure: COLONOSCOPY WITH PROPOFOL;  Surgeon: Toledo, Benay Pike, MD;  Location: [facility] ENDOSCOPY;  Service: Gastroenterology;  Laterality: N/A;   ESOPHAGOGASTRODUODENOSCOPY     HARDWARE REMOVAL Left 08/08/1974   removal of plate, left radius, bone graft, left ulna from left ilium   HEMICOLECTOMY Left 1992   due to diverticulitis   JOINT REPLACEMENT     ORIF DISTAL RADIUS FRACTURE Left 08/31/1973   compression plate and small bone plate   POLYPECTOMY     REVISION TOTAL HIP ARTHROPLASTY Left 1997   TOTAL HIP ARTHROPLASTY Left 1992   TOTAL HIP ARTHROPLASTY Right 2003   TOTAL HIP REVISION Left 07/14/2017   Procedure: TOTAL HIP REVISION;  Surgeon: Dereck Leep, MD;  Location: [facility] ORS;  Service: Orthopedics;  Laterality: Left;   UPPER GI ENDOSCOPY  2009   stretched esophagus    Prior to Admission medications   Medication Sig Start Date End Date Taking? Authorizing Provider  acetaminophen (TYLENOL 8 HOUR) 650 MG CR tablet Take 650 mg by mouth every 8 (eight) hours as needed for pain.    [provider]  albuterol (PROVENTIL HFA;VENTOLIN HFA) 108 (  90 Base) MCG/ACT inhaler Inhale 2 puffs into the lungs every 4 (four) hours as needed for wheezing. Patient not taking: Reported on 08/24/2018 04/10/18   Marylene Land, NP  benzonatate (TESSALON PERLES) 100 MG capsule Take 1 capsule (100 mg total) by mouth 3 (three) times daily as needed for cough. Patient not taking: Reported on 08/24/2018 04/10/18   Marylene Land, NP  chlorpheniramine-HYDROcodone [facility] ER) 10-8 MG/5ML SUER Take 3 mLs by mouth at bedtime as needed for cough. do not drive or operate  machinery while taking as can cause drowsiness. Patient not taking: Reported on 08/24/2018 04/10/18   Marylene Land, NP  clidinium-chlordiazePOXIDE (LIBRAX) 5-2.5 MG capsule Take 1 capsule by mouth 4 (four) times daily -  before meals and at bedtime.    [provider]  doxycycline (VIBRAMYCIN) 100 MG capsule Take 1 capsule (100 mg total) by mouth 2 (two) times daily. Patient not taking: Reported on 08/24/2018 04/10/18   Marylene Land, NP  fexofenadine (ALLEGRA) 180 MG tablet Take 180 mg by mouth daily.    [provider]  fluticasone (FLONASE) 50 MCG/ACT nasal spray Place 1 spray into both nostrils as needed for allergies or rhinitis.    [provider]  GNP GARLIC EXTRACT PO Take 2 capsules by mouth daily.    [provider]  LACTOBACILLUS BIFIDUS PO Take 1 tablet by mouth daily.    [provider]  losartan-hydrochlorothiazide Konrad Penta) 50-12.5 MG tablet Take by mouth. 01/12/18 01/12/19  [provider]  Magnesium Oxide 500 MG TABS Take 1 tablet by mouth daily.    [provider]  montelukast (SINGULAIR) 10 MG tablet Take 10 mg by mouth daily as needed.    [provider]  Multiple Vitamins-Minerals (EQ COMPLETE MULTIVITAMIN-ADULT PO) Take 1 tablet by mouth daily.    [provider]  Omega-3 Fatty Acids (OMEGA-3 FISH OIL PO) Take 1,040 mg by mouth daily.    [provider]  pantoprazole (PROTONIX) 40 MG tablet Take 1 tablet by mouth daily. 01/08/17   [provider]  potassium chloride (K-DUR,KLOR-CON) 10 MEQ tablet Take 1 tablet by mouth daily. 07/12/17 07/12/18  [provider]  pravastatin (PRAVACHOL) 40 MG tablet Take by mouth. 01/13/18 01/13/19  [provider]    Allergies Sulfa antibiotics, Lisinopril, and Citalopram  Family History  Problem Relation Age of Onset   Hypertension Mother    Alzheimer's disease Mother    Heart attack Mother    Diabetes Father     Hypertension Father    Stroke Father    Breast cancer Cousin 55       mat cousin   Breast cancer Other 55   Lung cancer Brother    Cancer Brother    Thyroid disease Sister    Hypertension Sister    Colonic polyp Sister    Colonic polyp Brother    Diverticulitis Brother     Social History Social History   Tobacco Use   Smoking status: Never   Smokeless tobacco: Never  Vaping Use   Vaping Use: Never used  Substance Use Topics   Alcohol use: Not Currently   Drug use: Never    Review of Systems Constitutional: No fever/chills Eyes: No visual changes. ENT: No sore throat. Cardiovascular: Denies chest pain. Respiratory: Denies shortness of breath. Gastrointestinal: No abdominal pain.  No nausea, no vomiting.  No diarrhea.   Genitourinary: Negative for dysuria. Increased urgency.  Musculoskeletal: Left shoulder pain. Skin: Negative for rash. Neurological: Occasional weakness  in left leg.   ____________________________________________   PHYSICAL EXAM:  VITAL SIGNS: ED Triage Vitals  Enc Vitals Group     BP 10/07/20 1417 (!) 143/88     Pulse Rate 10/07/20 1417 72     Resp 10/07/20 1417 18     Temp 10/07/20 1417 98.4 F (36.9 C)     Temp Source 10/07/20 1417 Oral     SpO2 10/07/20 1417 100 %     Weight 10/07/20 1456 180 lb (81.6 kg)     Height 10/07/20 1456 '5\' 1"'$  (1.549 m)     Head Circumference --      Peak Flow --      Pain Score 10/07/20 1456 5   Constitutional: Alert and oriented.  Eyes: Conjunctivae are normal.  ENT      Head: Normocephalic and atraumatic.      Nose: No congestion/rhinnorhea.      Mouth/Throat: Mucous membranes are moist.      Neck: No stridor. Hematological/Lymphatic/Immunilogical: No cervical lymphadenopathy. Cardiovascular: Normal rate, regular rhythm.  No murmurs, rubs, or gallops.  Respiratory: Normal respiratory effort without tachypnea nor retractions. Breath sounds are clear and equal bilaterally. No  wheezes/rales/rhonchi. Gastrointestinal: Soft and non tender. No rebound. No guarding.  Genitourinary: Deferred Musculoskeletal: Normal range of motion in all extremities. No lower extremity edema. Neurologic:  Normal speech and language. Face symmetric. No upper extremity pronator drift. Strength 5/5 in upper and lower extremities. Sensation intact. No gross focal neurologic deficits are appreciated.  Skin:  Skin is warm, dry and intact. No rash noted. Psychiatric: Mood and affect are normal. Speech and behavior are normal. Patient exhibits appropriate insight and judgment.  ____________________________________________    LABS (pertinent positives/negatives)  BMP wnl CBC wbc 6.6, hgb 12.8, plt 213 UA clear, leukocytes, nitrite negative, 0-5 RBC and WBC ____________________________________________   EKG  I, Nance Pear, attending physician, personally viewed and interpreted this EKG  EKG Time: 1505 Rate: 70 Rhythm: sinus rhythm Axis: left axis deviation Intervals: qtc 434 QRS: LBBB ST changes: no st elevation Impression: abnormal ekg  ____________________________________________    RADIOLOGY  Left shoulder No acute abnormality  Left hip Bilateral hip replacement without acute fracture or dislocation.  ____________________________________________   PROCEDURES  Procedures  ____________________________________________   INITIAL IMPRESSION / ASSESSMENT AND PLAN / ED COURSE  Pertinent labs & imaging results that were available during my care of the patient were reviewed by me and considered in my medical decision making (see chart for details).  Patient presented to the emergency department today because of concern for left leg going out on her. On my exam the patient has good strength in bilateral legs. After my initial exam patient did mention that she has had some ringing in her left ear and has history of vertigo. The patient was given meclizine. Did feel  better afterwards. She had also mentioned concern for increased urinary urgency, UA not consistent with infection at this time. Given improvement, will plan on discharge. Encouraged return for any worsening symptoms.   ____________________________________________   FINAL CLINICAL IMPRESSION(S) / ED DIAGNOSES  Final diagnoses:  Weakness     Note: This dictation was prepared with Dragon dictation. Any transcriptional errors that result from this process are unintentional     Nance Pear, MD 10/07/20 [phone]

## 2021-02-17 ENCOUNTER — Other Ambulatory Visit: Payer: Self-pay | Admitting: Family Medicine

## 2021-02-17 DIAGNOSIS — Z1231 Encounter for screening mammogram for malignant neoplasm of breast: Secondary | ICD-10-CM

## 2021-03-07 ENCOUNTER — Emergency Department: Payer: Medicare Other

## 2021-03-07 ENCOUNTER — Emergency Department
Admission: EM | Admit: 2021-03-07 | Discharge: 2021-03-07 | Disposition: A | Discharge status: Home / Self Care | Payer: Medicare Other | Attending: Student in an Organized Health Care Education/Training Program | Admitting: Student in an Organized Health Care Education/Training Program

## 2021-03-07 ENCOUNTER — Other Ambulatory Visit: Payer: Self-pay

## 2021-03-07 ENCOUNTER — Encounter: Payer: Self-pay | Admitting: Emergency Medicine

## 2021-03-07 DIAGNOSIS — R0789 Other chest pain: Secondary | ICD-10-CM | POA: Insufficient documentation

## 2021-03-07 LAB — BASIC METABOLIC PANEL
Anion gap: 6 (ref 5–15)
BUN: 8 mg/dL (ref 8–23)
CO2: 30 mmol/L (ref 22–32)
Calcium: 9.7 mg/dL (ref 8.9–10.3)
Chloride: 100 mmol/L (ref 98–111)
Creatinine, Ser: 0.53 mg/dL (ref 0.44–1.00)
GFR, Estimated: >60 mL/min (ref >60)
Glucose, Bld: 105 mg/dL — ABNORMAL HIGH (ref 70–99)
Potassium: 3.7 mmol/L (ref 3.5–5.1)
Sodium: 136 mmol/L (ref 135–145)

## 2021-03-07 LAB — CBC
HCT: 40.9 % (ref 36.0–46.0)
Hemoglobin: 12.7 g/dL (ref 12.0–15.0)
MCH: 23.6 pg — ABNORMAL LOW (ref 26.0–34.0)
MCHC: 31.1 g/dL (ref 30.0–36.0)
MCV: 75.9 fL — ABNORMAL LOW (ref 80.0–100.0)
Platelets: 216 10*3/uL (ref 150–400)
RBC: 5.39 MIL/uL — ABNORMAL HIGH (ref 3.87–5.11)
RDW: 14.9 % (ref 11.5–15.5)
WBC: 8.9 10*3/uL (ref 4.0–10.5)
nRBC: 0 % (ref 0.0–0.2)

## 2021-03-07 LAB — HEPATIC FUNCTION PANEL
ALT: 15 U/L (ref 0–44)
AST: 25 U/L (ref 15–41)
Albumin: 4.1 g/dL (ref 3.5–5.0)
Alkaline Phosphatase: 58 U/L (ref 38–126)
Bilirubin, Direct: 0.2 mg/dL (ref 0.0–0.2)
Indirect Bilirubin: 1.4 mg/dL — ABNORMAL HIGH (ref 0.3–0.9)
Total Bilirubin: 1.6 mg/dL — ABNORMAL HIGH (ref 0.3–1.2)
Total Protein: 7.2 g/dL (ref 6.5–8.1)

## 2021-03-07 LAB — TROPONIN I (HIGH SENSITIVITY)
Troponin I (High Sensitivity): 8 ng/L (ref <18)
Troponin I (High Sensitivity): 9 ng/L (ref <18)

## 2021-03-07 LAB — LIPASE, BLOOD: Lipase: 27 U/L (ref 11–51)

## 2021-03-07 NOTE — ED Provider Triage Note (Signed)
Emergency Medicine Provider Triage Evaluation Note  Roberta Hanson , a 81 y.o. female  was evaluated in triage.  Pt complains of chest pain with indigestion yesterday but worse today with tightness.  Here per EMS.  Took medication for reflux and one for anxiety and feels better.  No cardiac history.  Review of Systems  Positive: Reflux, indigestion. tightness Negative: No fever, chills or coughing  Physical Exam  There were no vitals taken for this visit. Gen:   Awake, no distress  Talkative without noted shortness of breath. Resp:  Normal effort Lungs Clear, Heart RRR MSK:   Moves extremities without difficulty.  No edema lower extremities  Other:  Non tender abdomen to palpation.  BS normal x 4  Medical Decision Making  Medically screening exam initiated at 11:43 AM.  Appropriate orders placed.  Aura Fey was informed that the remainder of the evaluation will be completed by another provider, this initial triage assessment does not replace that evaluation, and the importance of remaining in the ED until their evaluation is complete.     Johnn Hai, PA-C 03/07/21 1151

## 2021-03-07 NOTE — ED Triage Notes (Signed)
C?O feeling gaseous this morning.  Took reflux medication and a nerve pill and feels better.  Burping in triage.

## 2021-03-07 NOTE — ED Provider Notes (Signed)
[facility] Provider Note    Event Date/Time   First MD Initiated Contact with Patient 03/07/21 1506     (approximate)   History   Chest Pain   HPI  Roberta Hanson is a 81 y.o. female with a history of reflux as well as diverticulitis as well as a known left bundle branch block presents to the ER for feeling of indigestion this morning.  States that she frequently has "gas "discomfort in the morning and felt similar symptoms today.  She felt she needed to belch but had some pain with this so she called EMS.  She was able to belch and she is now pain-free but as she had a left bundle branch block on EKG with EMS she was brought to the ER for evaluation she is currently pain-free she has no discomfort or concerns no shortness of breath.  She states that she been compliant with her GERD medications     Physical Exam   Triage Vital Signs: ED Triage Vitals  Enc Vitals Group     BP 03/07/21 1151 129/71     Pulse Rate 03/07/21 1151 82     Resp 03/07/21 1151 16     Temp 03/07/21 1151 98.1 F (36.7 C)     Temp Source 03/07/21 1151 Oral     SpO2 03/07/21 1151 94 %     Weight 03/07/21 1149 179 lb 14.3 oz (81.6 kg)     Height 03/07/21 1149 5\' 1"  (1.549 m)     Head Circumference --      Peak Flow --      Pain Score 03/07/21 1149 0     Pain Loc --      Pain Edu? --      Excl. in [place]? --     Most recent vital signs: Vitals:   03/07/21 1151 03/07/21 1540  BP: 129/71 (!) 115/54  Pulse: 82 67  Resp: 16 16  Temp: 98.1 F (36.7 C)   SpO2: 94% 99%     Constitutional: Alert  Eyes: Conjunctivae are normal.  Head: Atraumatic. Nose: No congestion/rhinnorhea. Mouth/Throat: Mucous membranes are moist.   Neck: Painless ROM.  Cardiovascular:   Good peripheral circulation. Respiratory: Normal respiratory effort.  No retractions.  Gastrointestinal: Soft and nontender in all four quadrants Musculoskeletal:  no deformity Neurologic:  MAE spontaneously. No gross  focal neurologic deficits are appreciated.  Skin:  Skin is warm, dry and intact. No rash noted. Psychiatric: Mood and affect are normal. Speech and behavior are normal.    ED Results / Procedures / Treatments   Labs (all labs ordered are listed, but only abnormal results are displayed) Labs Reviewed  BASIC METABOLIC PANEL - Abnormal; Notable for the following components:      Result Value   Glucose, Bld 105 (*)    All other components within normal limits  CBC - Abnormal; Notable for the following components:   RBC 5.39 (*)    MCV 75.9 (*)    MCH 23.6 (*)    All other components within normal limits  HEPATIC FUNCTION PANEL - Abnormal; Notable for the following components:   Total Bilirubin 1.6 (*)    Indirect Bilirubin 1.4 (*)    All other components within normal limits  LIPASE, BLOOD  TROPONIN I (HIGH SENSITIVITY)  TROPONIN I (HIGH SENSITIVITY)     EKG  ED ECG REPORT I, Merlyn Lot, the attending physician, personally viewed and interpreted this ECG.   Date:  03/07/2021  EKG Time: 11:46  Rate: 75  Rhythm: sinus  Axis: left  Intervals: normal qt  ST&T Change: lbbb, no sgarbossa criteria    RADIOLOGY Please see ED Course for my review and interpretation.  I personally reviewed all radiographic images ordered to evaluate for the above acute complaints and reviewed radiology reports and findings.  These findings were personally discussed with the patient.  Please see medical record for radiology report.    PROCEDURES:  Critical Care performed: No  .1-3 Lead EKG Interpretation Performed by: Merlyn Lot, MD Authorized by: Merlyn Lot, MD     Interpretation: abnormal     Interpretation comment:  LBBB   ECG rate:  70   ECG rate assessment: normal     Rhythm: sinus rhythm     MEDICATIONS ORDERED IN ED: Medications - No data to display   IMPRESSION / MDM / [place] / ED COURSE  I reviewed the triage vital signs and the nursing  notes.                              Differential diagnosis includes, but is not limited to, reflux, esophagitis, gas, esophageal spasm, ACS, dysrhythmia, PE, dissection, pancreatitis, biliary pathology, hepatitis, enteritis  Patient presenting currently very well-appearing in no acute distress with the above listed complaint and presentation in setting of history of GERD as well as left bundle blanch block.  EKG shows left bundle no scar Bosa criteria her initial troponin is negative.  She is low risk by heart score but given her past medical history will observe for serial enzymes.  Her abdominal exam is soft and benign.  Have added on LFTs as well as lipase.  Have very low suspicion for PE or dissection or pancreatitis.  Does not seem clinically consistent with ACS or dysrhythmia.  Most likely reflux.   Clinical Course as of 03/07/21 1630  Fri Mar 07, 2021  1523 My review of chest x-ray does not show any cardiomegaly or infiltrates or edema.   [PR]  1629 Repeat troponin stable is not consistent with ACS.  Patient feels well and is requesting discharge home.  She is tolerating p.o.  This appears stable and appropriate for outpatient follow-up [PR]    Clinical Course User Index [PR] Merlyn Lot, MD     FINAL CLINICAL IMPRESSION(S) / ED DIAGNOSES   Final diagnoses:  Atypical chest pain     Rx / DC Orders   ED Discharge Orders     None        Note:  This document was prepared using Dragon voice recognition software and may include unintentional dictation errors.    Merlyn Lot, MD 03/07/21 1630

## 2021-03-18 ENCOUNTER — Ambulatory Visit
Admission: RE | Admit: 2021-03-18 | Discharge: 2021-03-18 | Disposition: A | Discharge status: Home / Self Care | Payer: Medicare Other | Source: Ambulatory Visit | Attending: Family Medicine | Admitting: Family Medicine

## 2021-03-18 ENCOUNTER — Other Ambulatory Visit: Payer: Self-pay

## 2021-03-18 DIAGNOSIS — Z1231 Encounter for screening mammogram for malignant neoplasm of breast: Secondary | ICD-10-CM | POA: Diagnosis present

## 2021-04-01 ENCOUNTER — Other Ambulatory Visit: Payer: Self-pay | Admitting: Gastroenterology

## 2021-04-01 DIAGNOSIS — K219 Gastro-esophageal reflux disease without esophagitis: Secondary | ICD-10-CM

## 2021-04-01 DIAGNOSIS — R140 Abdominal distension (gaseous): Secondary | ICD-10-CM

## 2021-04-01 DIAGNOSIS — R1013 Epigastric pain: Secondary | ICD-10-CM

## 2021-04-01 DIAGNOSIS — R143 Flatulence: Secondary | ICD-10-CM

## 2021-04-11 ENCOUNTER — Ambulatory Visit
Admission: RE | Admit: 2021-04-11 | Discharge: 2021-04-11 | Disposition: A | Discharge status: Home / Self Care | Payer: Medicare Other | Source: Ambulatory Visit | Attending: Gastroenterology | Admitting: Gastroenterology

## 2021-04-11 DIAGNOSIS — R1013 Epigastric pain: Secondary | ICD-10-CM | POA: Diagnosis present

## 2021-04-11 DIAGNOSIS — R143 Flatulence: Secondary | ICD-10-CM | POA: Diagnosis present

## 2021-04-11 DIAGNOSIS — R140 Abdominal distension (gaseous): Secondary | ICD-10-CM | POA: Insufficient documentation

## 2021-04-11 DIAGNOSIS — K219 Gastro-esophageal reflux disease without esophagitis: Secondary | ICD-10-CM | POA: Insufficient documentation

## 2021-07-24 ENCOUNTER — Ambulatory Visit: Payer: Medicare Other | Admitting: Dermatology

## 2021-07-24 DIAGNOSIS — L811 Chloasma: Secondary | ICD-10-CM | POA: Diagnosis not present

## 2021-07-24 DIAGNOSIS — L821 Other seborrheic keratosis: Secondary | ICD-10-CM | POA: Diagnosis not present

## 2021-07-24 NOTE — Patient Instructions (Addendum)
Will prescribe [facility] Hydroquinone 12%/kojic acid/vitamin C cream pea sized amount once daily to the entire face for up to 3 months. This cannot be used more than 3 months due to risk of exogenous ochronosis (permanent dark spots). The patient was advised this is not covered by insurance since it is made by a compounding pharmacy. They will receive an email to check out and the medication will be mailed to their home.     Instructions for [facility] Medications  One or more of your medications was sent to the [facility] mail order compounding pharmacy. You will receive an email from them and can purchase the medicine through that link. It will then be mailed to your home at the address you confirmed. If for any reason you do not receive an email from them, please check your spam folder. If you still do not find the email, please let us know. [facility] phone number is [phone].   If You Need Anything After Your Visit  If you have any questions or concerns for your doctor, please call our main line at [phone] and press option 4 to reach your doctor's medical assistant. If no one answers, please leave a voicemail as directed and we will return your call as soon as possible. Messages left after 4 pm will be answered the following business day.   You may also send [place] a message via [place]. We typically respond to MyChart messages within 1-2 business days.  For prescription refills, please ask your pharmacy to contact our office. Our fax number is [phone].  If you have an urgent issue when the clinic is closed that cannot wait until the next business day, you can page your doctor at the number below.    Please note that while we do our best to be available for urgent issues outside of office hours, we are not available 24/7.   If you have an urgent issue and are unable to reach [place], you may choose to seek medical care at your doctor's office, retail clinic, urgent  care center, or emergency room.  If you have a medical emergency, please immediately call 911 or go to the emergency department.  Pager Numbers  - Dr. Nehemiah Massed: [phone]  - Dr. Laurence Ferrari: [phone]  - Dr. Nicole Kindred: [phone]  In the event of inclement weather, please call our main line at [phone] for an update on the status of any delays or closures.  Dermatology Medication Tips: Please keep the boxes that topical medications come in in order to help keep track of the instructions about where and how to use these. Pharmacies typically print the medication instructions only on the boxes and not directly on the medication tubes.   If your medication is too expensive, please contact our office at [phone] option 4 or send [place] a message through [place].   We are unable to tell what your co-pay for medications will be in advance as this is different depending on your insurance coverage. However, we may be able to find a substitute medication at lower cost or fill out paperwork to get insurance to cover a needed medication.   If a prior authorization is required to get your medication covered by your insurance company, please allow [place] 1-2 business days to complete this process.  Drug prices often vary depending on where the prescription is filled and some pharmacies may offer cheaper prices.  The website www.goodrx.com contains coupons for medications through different pharmacies. The prices here do  not account for what the cost may be with help from insurance (it may be cheaper with your insurance), but the website can give you the price if you did not use any insurance.  - You can print the associated coupon and take it with your prescription to the pharmacy.  - You may also stop by our office during regular business hours and pick up a GoodRx coupon card.  - If you need your prescription sent electronically to a different pharmacy, notify our office through [facility] or  by phone at [phone] option 4.     Si Usted Necesita Algo Despus de Su Visita  Tambin puede enviarnos un mensaje a travs de Pharmacist, community. Por lo general respondemos a los mensajes de MyChart en el transcurso de 1 a 2 das hbiles.  Para renovar recetas, por favor pida a su farmacia que se ponga en contacto con nuestra oficina. Harland Dingwall de fax es [place] [phone].  Si tiene un asunto urgente cuando la clnica est cerrada y que no puede esperar hasta el siguiente da hbil, puede llamar/localizar a su doctor(a) al nmero que aparece a continuacin.   Por favor, tenga en cuenta que aunque hacemos todo lo posible para estar disponibles para asuntos urgentes fuera del horario de [place], no estamos disponibles las 24 horas del da, los 7 das de la [place].   Si tiene un problema urgente y no puede comunicarse con nosotros, puede optar por buscar atencin mdica  en el consultorio de su doctor(a), en una clnica privada, en un centro de atencin urgente o en una sala de emergencias.  Si tiene Engineering geologist, por favor llame inmediatamente al 911 o vaya a la sala de emergencias.  Nmeros de bper  - Dr. Nehemiah Massed: [phone]  - Dra. Moye: [phone]  - Dra. Nicole Kindred: [phone]  En caso de inclemencias del [place], por favor llame a Johnsie Kindred principal al [phone] para una actualizacin sobre el [place] de cualquier retraso o cierre.  Consejos para la medicacin en dermatologa: Por favor, guarde las cajas en las que vienen los medicamentos de uso tpico para ayudarle a seguir las instrucciones sobre dnde y cmo usarlos. Las farmacias generalmente imprimen las instrucciones del medicamento slo en las cajas y no directamente en los tubos del [place].   Si su medicamento es muy caro, por favor, pngase en contacto con Zigmund Daniel llamando al [phone] y presione la opcin 4 o envenos un mensaje a travs de Pharmacist, community.   No podemos decirle cul ser su  copago por los medicamentos por adelantado ya que esto es diferente dependiendo de la cobertura de su seguro. Sin embargo, es posible que podamos encontrar un medicamento sustituto a Electrical engineer un formulario para que el seguro cubra el medicamento que se considera necesario.   Si se requiere una autorizacin previa para que su compaa de seguros [place] su medicamento, por favor permtanos de 1 a 2 das hbiles para completar este proceso.  Los precios de los medicamentos varan con frecuencia dependiendo del Environmental consultant de dnde se surte la receta y alguna farmacias pueden ofrecer precios ms baratos.  El sitio web www.goodrx.com tiene cupones para medicamentos de Airline pilot. Los precios aqu no tienen en cuenta lo que podra costar con la ayuda del seguro (puede ser ms barato con su seguro), pero el sitio web puede darle el precio si no utiliz Research scientist (physical sciences).  - Puede imprimir el cupn correspondiente y llevarlo con su receta a la farmacia.  -  Tambin puede pasar por nuestra oficina durante el horario de atencin regular y Charity fundraiser una tarjeta de cupones de GoodRx.  - Si necesita que su receta se enve electrnicamente a una farmacia diferente, informe a nuestra oficina a travs de MyChart de [place] o por telfono llamando al [phone] y presione la opcin 4.

## 2021-07-24 NOTE — Progress Notes (Signed)
   New Patient Visit  Subjective  Roberta Hanson is a 81 y.o. female who presents for the following: Nevus (Neck, inframammary, L shoulder, no symptoms, has had for years) and check brown patch (R upper eyelid, 20yr, no symptoms).  New patient referral from Dr. DThereasa Distance  The following portions of the chart were reviewed this encounter and updated as appropriate:   Tobacco  Allergies  Meds  Problems  Med Hx  Surg Hx  Fam Hx     Review of Systems:  No other skin or systemic complaints except as noted in HPI or Assessment and Plan.  Objective  Well appearing patient in no apparent distress; mood and affect are within normal limits.  A focused examination was performed including face, neck, trunk. Relevant physical exam findings are noted in the Assessment and Plan.  R lat neck, L inframammary Pedunculated stuck on waxy pap R lat neck, waxy stuck on paps L inframammary  R upper eyelid, R face Reticulated hyperpigmented patches.           Assessment & Plan  Seborrheic keratosis R lat neck, L inframammary Benign-appearing.  Observation.  Call clinic for new or changing lesions.  Recommend daily use of broad spectrum spf 30+ sunscreen to sun-exposed areas.   Melasma R upper eyelid, R face  Melasma is a chronic; persistent condition of hyperpigmented patches generally on the face, worse in summer due to higher UV exposure.    Heredity; thyroid disease; sun exposure; pregnancy; birth control pills; epilepsy medication and darker skin may predispose to Melasma.   Recommendations include: - Sun avoidance and daily broad spectrum (UVA/UVB) sunscreen SPF 30+, preferably with Zinc or Titanium Dioxide. - Rx topical bleaching creams (i.e. hydroquinone) is a common treatment but should not be used long term.  Hydroquinones may be mixed with retinoids; steroids; Kojic Acid. - Rx Azelaic Acid is also a treatment option that is safe for pregnancy (Category B). - OTC  Heliocare can be helpful in control and prevention. - Oral Rx with Tranexamic Acid 250 mg - 650 mg po daily can be used for moderate to severe cases especially during summer (contraindications include pregnancy; lactation; hx of PE; DVT; clotting disorder; heart disease; anticoagulant use and upcoming long trips)   - Chemical peels (would need multiple for best result).  - Lasers and  Microdermabrasion may also be helpful adjunct treatments.  Will prescribe Skin Medicinals Hydroquinone 12%/kojic acid/vitamin C cream pea sized amount once daily to the entire face for up to 3 months. This cannot be used more than 3 months due to risk of exogenous ochronosis (permanent dark spots). The patient was advised this is not covered by insurance since it is made by a compounding pharmacy. They will receive an email to check out and the medication will be mailed to their home.    Seborrheic Keratoses - Stuck-on, waxy, tan-brown papules and/or plaques  - Benign-appearing - Discussed benign etiology and prognosis. - Observe - Call for any changes  Return in about 6 months (around 01/24/2022) for melasma, sk.  I, Sonya Hupman, RMA, am acting as scribe for DSarina Ser MD . Documentation: I have reviewed the above documentation for accuracy and completeness, and I agree with the above.  DSarina Ser MD

## 2021-07-28 ENCOUNTER — Encounter: Payer: Self-pay | Admitting: Dermatology

## 2022-01-18 IMAGING — MG MM DIGITAL SCREENING BILAT W/ TOMO AND CAD
6 of 12 series · 6 of 36 positions shown · non-contrast
Comparison: Previous exam(s).

CLINICAL DATA: Screening.

EXAM:
DIGITAL SCREENING BILATERAL MAMMOGRAM WITH TOMOSYNTHESIS AND CAD
TECHNIQUE: Bilateral screening digital craniocaudal and mediolateral oblique
mammograms were obtained. Bilateral screening digital breast
tomosynthesis was performed. The images were evaluated with
computer-aided detection.

[L CC synth-2D (1 of 2)]
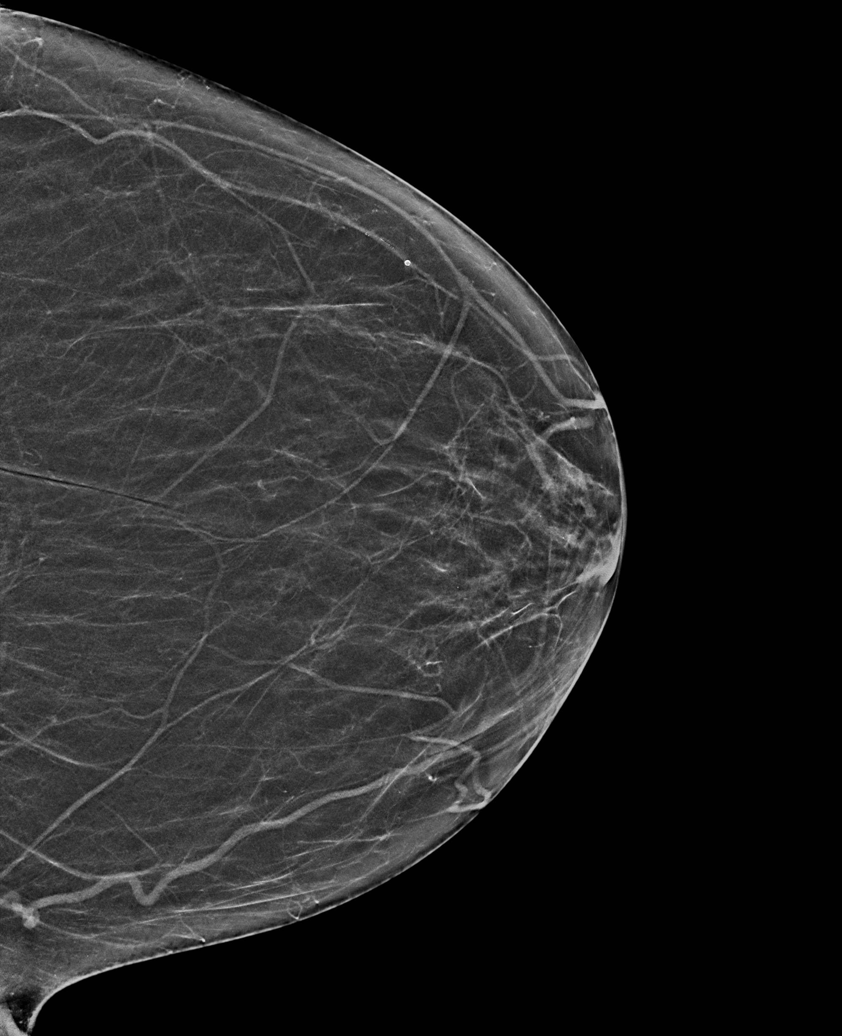

[L CC synth-2D (2 of 2)]
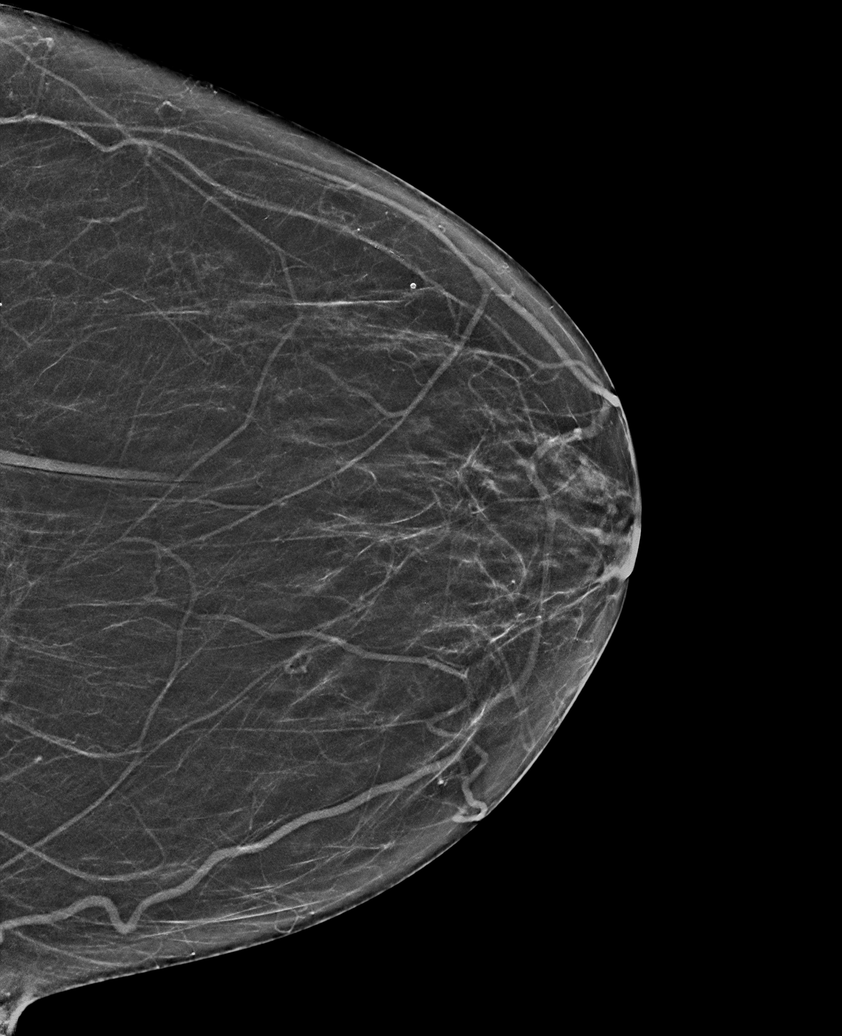

[L MLO synth-2D (1 of 2)]
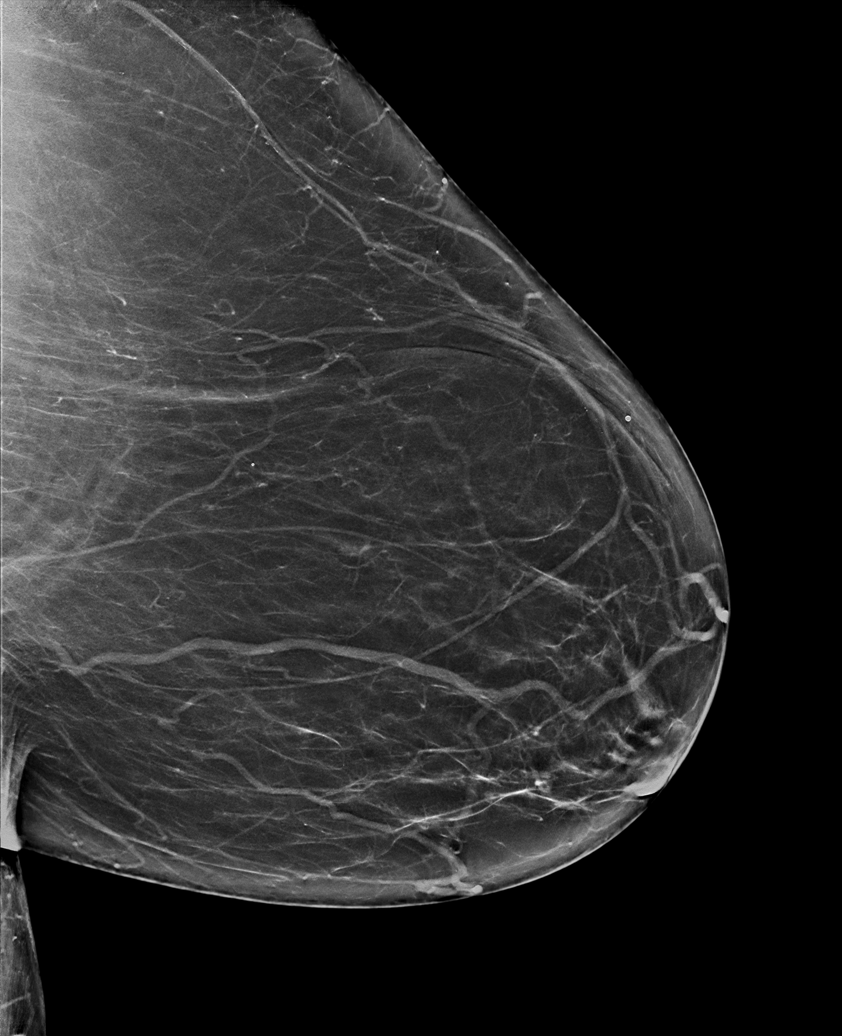

[L MLO synth-2D (2 of 2)]
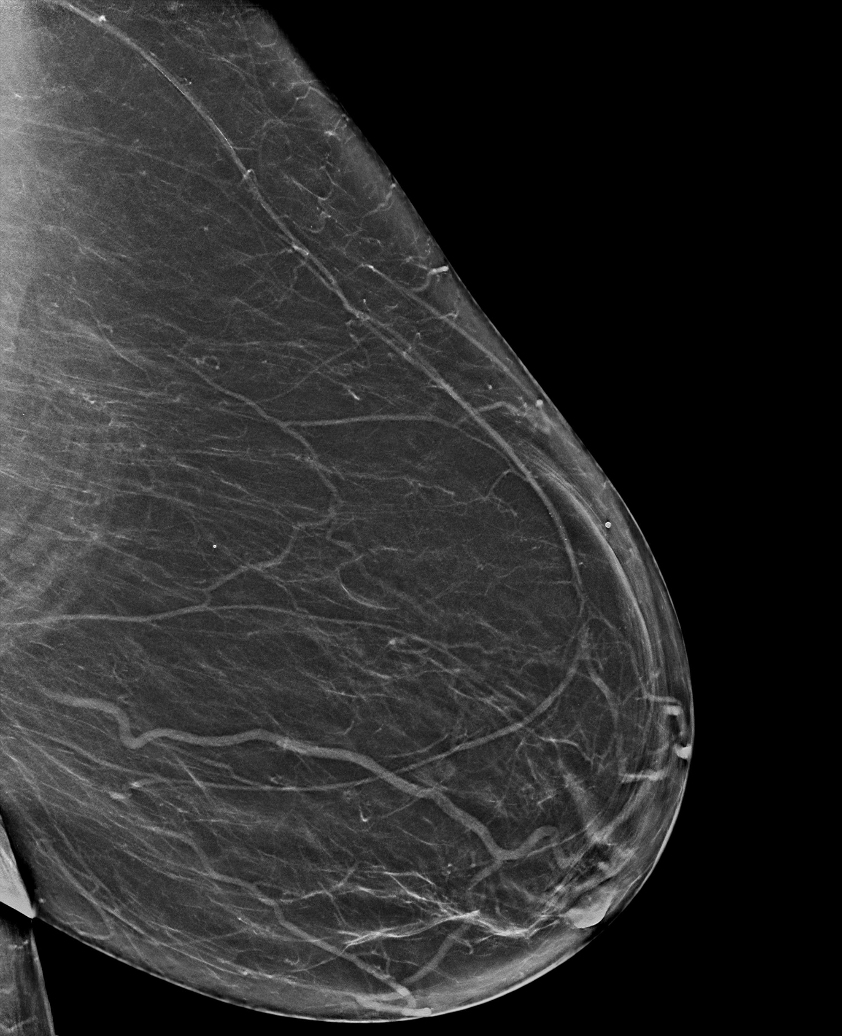

[R MLO synth-2D]
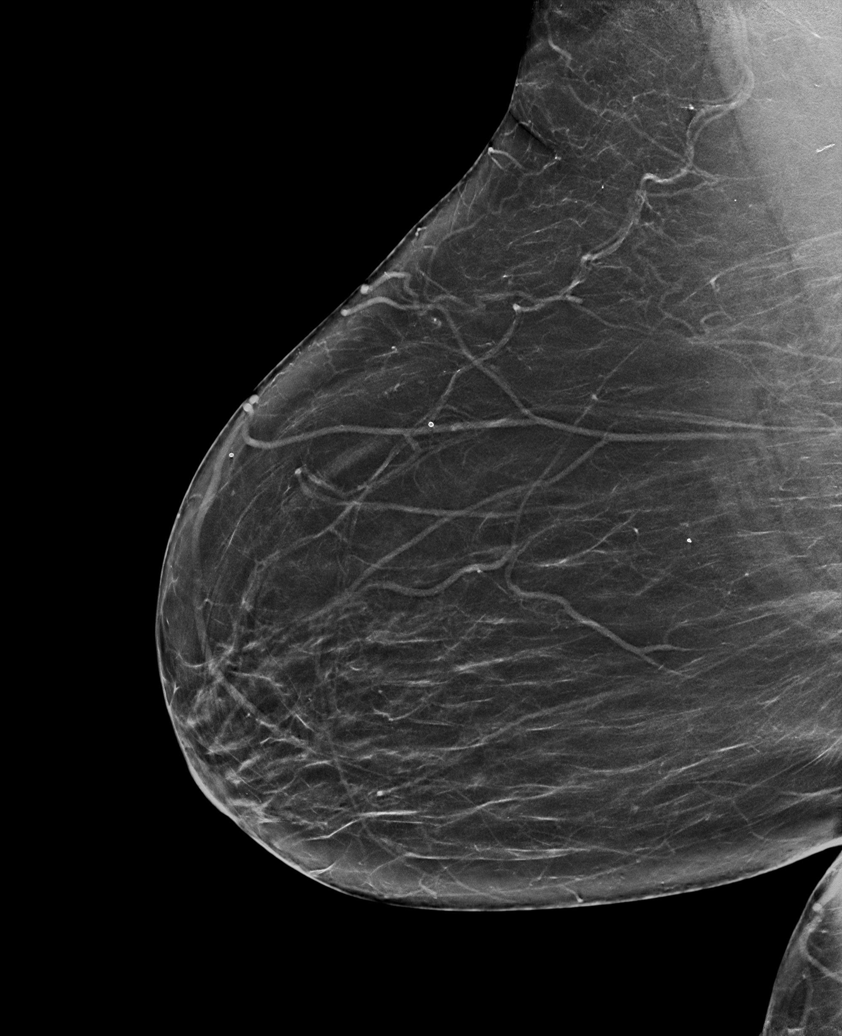

[R CC synth-2D]
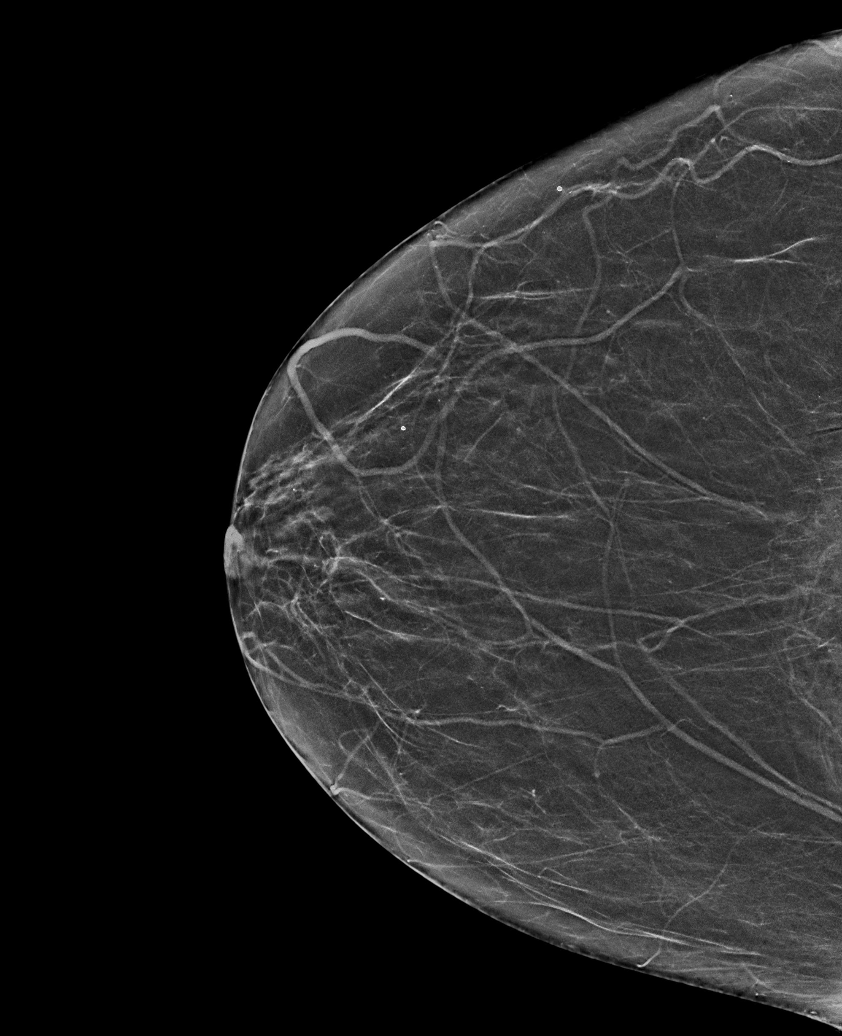

[6 of 36 positions shown; findings below may reference images not displayed]

ACR Breast Density Category b: There are scattered areas of
fibroglandular density.
FINDINGS: There are no findings suspicious for malignancy.
IMPRESSION: No mammographic evidence of malignancy. A result letter of this
screening mammogram will be mailed directly to the patient.

RECOMMENDATION:
Screening mammogram in one year. (Code:51-O-LD2)

BI-RADS CATEGORY  1: Negative.

## 2022-02-02 ENCOUNTER — Ambulatory Visit: Payer: Medicare Other | Admitting: Dermatology

## 2022-04-11 ENCOUNTER — Ambulatory Visit
Admission: EM | Admit: 2022-04-11 | Discharge: 2022-04-11 | Disposition: A | Discharge status: Home / Self Care | Payer: Medicare Other | Attending: Emergency Medicine | Admitting: Emergency Medicine

## 2022-04-11 DIAGNOSIS — K30 Functional dyspepsia: Secondary | ICD-10-CM | POA: Diagnosis not present

## 2022-04-11 MED ORDER — FAMOTIDINE 20 MG PO TABS: 20.0000 mg | ORAL_TABLET | Freq: Two times a day (BID) | ORAL | 0 refills | Status: DC

## 2022-04-11 MED ORDER — SIMETHICONE 80 MG PO CHEW: 80.0000 mg | CHEWABLE_TABLET | Freq: Four times a day (QID) | ORAL | 0 refills | Status: DC | PRN

## 2022-04-11 NOTE — Discharge Instructions (Addendum)
Your symptoms are most likely an exacerbation of your IBS, by stress and anxiety.  I want you to continue taking her Protonix daily as prescribed and also your IBgard.  Take famotidine 20 mg twice daily to help decrease acid production in your stomach and see if this helps her symptoms.  You can also use the simethicone tablets every 6 hours to help with excessive gas.  Avoid fried foods and follow a bland diet for the next week to see if this improves her symptoms.  If you do not have improvement of your symptoms, or your symptoms worsen, you need to follow-up with gastroenterology at [facility] clinic.  If you develop severe abdominal pain, fever, nausea or vomiting and cannot keep down medication or fluids you need to go to the ER for evaluation.

## 2022-04-11 NOTE — ED Provider Notes (Signed)
[facility]    CSN: [id number] Arrival date & time: 04/11/22  0850      History   Chief Complaint Chief Complaint  Patient presents with   Abdominal Pain    HPI Roberta Hanson is a 82 y.o. female.   HPI  82 year old female here for evaluation of GI complaints.  The patient reports that she has been experiencing excess gas for the last 2 to 3 weeks.  She states that this is aggravated by eating fried foods, which she has been doing more of.  She has excessive burping and also a sour taste in the back of her throat from time to time.  She does have a history of reflux and she takes pantoprazole daily.  She is also been diagnosed with IBS and has been taking over-the-counter IBgard supplement.  She states that this is not helping the gas that she is experiencing.  She denies any nausea, vomiting, or constipation.  She states she is moving her bowels.  She did have 1 episode of diarrhea.  She denies any abdominal pain.  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   Colon polyp    Degenerative disc disease, cervical 12/2013   Degenerative disc disease, cervical    Diverticulitis    Diverticulosis    Eosinophilia    Food intolerance    GERD (gastroesophageal reflux disease) 01/01/2014   High risk medication use    Hyperlipidemia, mixed    Hypertension    Irritable bowel syndrome    Irritable bowel syndrome    Mixed hyperlipidemia    Obesity (BMI 35.0-39.9 without comorbidity)    Osteoarthritis 01/01/2014   Seasonal allergies 01/01/2014    Patient Active Problem List   Diagnosis Date Noted   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   Primary osteoarthritis of right knee 12/23/2016   Bundle branch block, 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   Irritable bowel syndrome 07/04/2015   Tinnitus, bilateral 01/02/2015   Benign essential hypertension 01/01/2014   Degenerative disc disease, cervical 01/01/2014   Eosinophil count raised  01/01/2014   GERD (gastroesophageal reflux disease) 01/01/2014   Mixed hyperlipidemia 01/01/2014    Past Surgical History:  Procedure Laterality Date   BILATERAL CARPAL TUNNEL RELEASE     right 06/04/2006 and left 08/15/2007   COLON SURGERY     COLONOSCOPY     COLONOSCOPY WITH PROPOFOL N/A 08/24/2018   Procedure: COLONOSCOPY WITH PROPOFOL;  Surgeon: Toledo, Benay Pike, MD;  Location: [facility] END[facility]COPY;  Service: Gastroenterology;  Laterality: N/A;   ESOPHAGOGASTRODUODENOSCOPY     HARDWARE REMOVAL Left 08/08/1974   removal of plate, left radius, bone graft, left ulna from left ilium   HEMICOLECTOMY Left 1992   due to diverticulitis   JOINT REPLACEMENT     ORIF DISTAL RADIUS FRACTURE Left 08/31/1973   compression plate and small bone plate   POLYPECTOMY     REVISION TOTAL HIP ARTHROPLASTY Left 1997   TOTAL HIP ARTHROPLASTY Left 1992   TOTAL HIP ARTHROPLASTY Right 2003   TOTAL HIP REVISION Left 07/14/2017   Procedure: TOTAL HIP REVISION;  Surgeon: Dereck Leep, MD;  Location: [facility] ORS;  Service: Orthopedics;  Laterality: Left;   UPPER GI ENDOSCOPY  2009   stretched esophagus    OB History   No obstetric history on file.      Home Medications    Prior to Admission medications   Medication Sig Start Date End Date  Taking? Authorizing Provider  acetaminophen (TYLENOL 8 HOUR) 650 MG CR tablet Take 650 mg by mouth every 8 (eight) hours as needed for pain.   Yes [provider]  clidinium-chlordiazePOXIDE (LIBRAX) 5-2.5 MG capsule Take 1 capsule by mouth 4 (four) times daily -  before meals and at bedtime.   Yes [provider]  famotidine (PEPCID) 20 MG tablet Take 1 tablet (20 mg total) by mouth 2 (two) times daily. 04/11/22  Yes Margarette Canada, NP  fexofenadine (ALLEGRA) 180 MG tablet Take 180 mg by mouth daily.   Yes [provider]  fluticasone (FLONASE) 50 MCG/ACT nasal spray Place 1 spray into both nostrils as needed for allergies or rhinitis.   Yes  [provider]  GNP GARLIC EXTRACT PO Take 2 capsules by mouth daily.   Yes [provider]  LACTOBACILLUS BIFIDUS PO Take 1 tablet by mouth daily.   Yes [provider]  losartan-hydrochlorothiazide Konrad Penta) 50-12.5 MG tablet Take by mouth. 01/12/18 04/11/22 Yes [provider]  Magnesium Oxide 500 MG TABS Take 1 tablet by mouth daily.   Yes [provider]  meclizine (ANTIVERT) 25 MG tablet Take 1 tablet (25 mg total) by mouth 3 (three) times daily as needed for dizziness. 10/07/20  Yes Nance Pear, MD  montelukast (SINGULAIR) 10 MG tablet Take 10 mg by mouth daily as needed.   Yes [provider]  Multiple Vitamins-Minerals (EQ COMPLETE MULTIVITAMIN-ADULT PO) Take 1 tablet by mouth daily.   Yes [provider]  Omega-3 Fatty Acids (OMEGA-3 FISH OIL PO) Take 1,040 mg by mouth daily.   Yes [provider]  pantoprazole (PROTONIX) 40 MG tablet Take 1 tablet by mouth daily. 01/08/17  Yes [provider]  potassium chloride (K-DUR,KLOR-CON) 10 MEQ tablet Take 1 tablet by mouth daily. 07/12/17 04/11/22 Yes [provider]  pravastatin (PRAVACHOL) 40 MG tablet Take by mouth. 01/13/18 04/11/22 Yes [provider]  simethicone (GAS-X) 80 MG chewable tablet Chew 1 tablet (80 mg total) by mouth every 6 (six) hours as needed for flatulence. 04/11/22  Yes Margarette Canada, NP  albuterol (PROVENTIL HFA;VENTOLIN HFA) 108 (90 Base) MCG/ACT inhaler Inhale 2 puffs into the lungs every 4 (four) hours as needed for wheezing. Patient not taking: Reported on 08/24/2018 04/10/18   Marylene Land, NP  benzonatate (TESSALON PERLES) 100 MG capsule Take 1 capsule (100 mg total) by mouth 3 (three) times daily as needed for cough. Patient not taking: Reported on 08/24/2018 04/10/18   Marylene Land, NP  chlorpheniramine-HYDROcodone [facility] ER) 10-8 MG/5ML SUER Take 3 mLs by mouth at bedtime as needed for cough. do not  drive or operate machinery while taking as can cause drowsiness. Patient not taking: Reported on 08/24/2018 04/10/18   Marylene Land, NP  doxycycline (VIBRAMYCIN) 100 MG capsule Take 1 capsule (100 mg total) by mouth 2 (two) times daily. Patient not taking: Reported on 08/24/2018 04/10/18   Marylene Land, NP    Family History Family History  Problem Relation Age of Onset   Hypertension Mother    Alzheimer's disease Mother    Heart attack Mother    Diabetes Father    Hypertension Father    Stroke Father    Breast cancer Cousin 50       mat cousin   Breast cancer Other 24   Lung cancer Brother    Cancer Brother    Thyroid disease Sister    Hypertension Sister    Colonic polyp Sister  Colonic polyp Brother    Diverticulitis Brother     Social History Social History   Tobacco Use   Smoking status: Never   Smokeless tobacco: Never  Vaping Use   Vaping Use: Never used  Substance Use Topics   Alcohol use: Not Currently   Drug use: Never     Allergies   Sulfa antibiotics, Lisinopril, and Citalopram   Review of Systems Review of Systems  Gastrointestinal:  Positive for abdominal distention and diarrhea. Negative for constipation, nausea and vomiting.     Physical Exam Triage Vital Signs ED Triage Vitals  Enc Vitals Group     BP 04/11/22 0929 (!) 159/90     Pulse Rate 04/11/22 0929 85     Resp 04/11/22 0929 16     Temp 04/11/22 0929 98.4 F (36.9 C)     Temp Source 04/11/22 0929 Oral     SpO2 04/11/22 0929 96 %     Weight 04/11/22 0928 178 lb (80.7 kg)     Height 04/11/22 0928 5' 1"$  (1.549 m)     Head Circumference --      Peak Flow --      Pain Score 04/11/22 0928 0     Pain Loc --      Pain Edu? --      Excl. in [place]? --    No data found.  Updated Vital Signs BP (!) 159/90 (BP Location: Left Arm)   Pulse 85   Temp 98.4 F (36.9 C) (Oral)   Resp 16   Ht 5' 1"$  (1.549 m)   Wt 178 lb (80.7 kg)   SpO2 96%   BMI 33.63 kg/m   Visual Acuity Right  Eye Distance:   Left Eye Distance:   Bilateral Distance:    Right Eye Near:   Left Eye Near:    Bilateral Near:     Physical Exam Vitals and nursing note reviewed.  Constitutional:      Appearance: Normal appearance. She is not ill-appearing.  HENT:     Head: Normocephalic and atraumatic.  Cardiovascular:     Rate and Rhythm: Normal rate and regular rhythm.     Pulses: Normal pulses.     Heart sounds: Normal heart sounds. No murmur heard.    No friction rub. No gallop.  Pulmonary:     Effort: Pulmonary effort is normal.     Breath sounds: Normal breath sounds. No wheezing, rhonchi or rales.  Abdominal:     General: There is distension.     Palpations: Abdomen is soft.     Tenderness: There is no abdominal tenderness. There is no guarding or rebound.  Skin:    General: Skin is warm and dry.     Capillary Refill: Capillary refill takes less than 2 seconds.  Neurological:     General: No focal deficit present.     Mental Status: She is alert and oriented to person, place, and time.  Psychiatric:        Mood and Affect: Mood normal.        Behavior: Behavior normal.        Thought Content: Thought content normal.        Judgment: Judgment normal.      UC Treatments / Results  Labs (all labs ordered are listed, but only abnormal results are displayed) Labs Reviewed - No data to display  EKG   Radiology No results found.  Procedures Procedures (including critical care time)  Medications Ordered in  UC Medications - No data to display  Initial Impression / Assessment and Plan / UC Course  I have reviewed the triage vital signs and the nursing notes.  Pertinent labs & imaging results that were available during my care of the patient were reviewed by me and considered in my medical decision making (see chart for details).   Is a very pleasant, nontoxic-appearing 82 year old female here for evaluation of excessive gas x 2 to 3 weeks despite taking her pantoprazole  and her over-the-counter supplement IBgard as directed by gastroenterology.  She has had symptoms similar in the past and per gastroenterology's report they think it is an exacerbation of her IBS secondary to anxiety and stress.  They have treated her with the IBgard, bland diet, Protonix, and famotidine in the past.  Her abdomen is protuberant but is soft and nontender.  She states she is moving her bowels.  I will have her continue the Protonix and IBgard.  I will add famotidine back to the mixture as well as simethicone to help with the excessive gas.  She should follow a bland diet for the next week or so to see if her symptoms improve.  If they do not she needs to follow-up with gastroenterology at [facility] clinic where she has been seen in the past.   Final Clinical Impressions(s) / UC Diagnoses   Final diagnoses:  Indigestion     Discharge Instructions      Your symptoms are most likely an exacerbation of your IBS, by stress and anxiety.  I want you to continue taking her Protonix daily as prescribed and also your IBgard.  Take famotidine 20 mg twice daily to help decrease acid production in your stomach and see if this helps her symptoms.  You can also use the simethicone tablets every 6 hours to help with excessive gas.  Avoid fried foods and follow a bland diet for the next week to see if this improves her symptoms.  If you do not have improvement of your symptoms, or your symptoms worsen, you need to follow-up with gastroenterology at [facility] clinic.  If you develop severe abdominal pain, fever, nausea or vomiting and cannot keep down medication or fluids you need to go to the ER for evaluation.     ED Prescriptions     Medication Sig Dispense Auth. Provider   famotidine (PEPCID) 20 MG tablet Take 1 tablet (20 mg total) by mouth 2 (two) times daily. 30 tablet Margarette Canada, NP   simethicone (GAS-X) 80 MG chewable tablet Chew 1 tablet (80 mg total) by mouth every 6 (six)  hours as needed for flatulence. 30 tablet Margarette Canada, NP      PDMP not reviewed this encounter.   Margarette Canada, NP 04/11/22 1001

## 2022-04-11 NOTE — ED Triage Notes (Signed)
Pt c/o abd pain x2-3 wks, states she was eating too much greasy food which caused her to burp excessively, hx of acid reflux & gerd. Denies any N/V/D

## 2022-05-14 ENCOUNTER — Other Ambulatory Visit: Payer: Self-pay | Admitting: Family Medicine

## 2022-05-14 DIAGNOSIS — Z1231 Encounter for screening mammogram for malignant neoplasm of breast: Secondary | ICD-10-CM

## 2022-05-19 ENCOUNTER — Ambulatory Visit
Admission: RE | Admit: 2022-05-19 | Discharge: 2022-05-19 | Disposition: A | Discharge status: Home / Self Care | Payer: Medicare Other | Source: Ambulatory Visit | Attending: Family Medicine | Admitting: Family Medicine

## 2022-05-19 DIAGNOSIS — Z1231 Encounter for screening mammogram for malignant neoplasm of breast: Secondary | ICD-10-CM | POA: Diagnosis not present

## 2022-10-25 NOTE — Discharge Instructions (Signed)

## 2022-11-08 ENCOUNTER — Ambulatory Visit (INDEPENDENT_AMBULATORY_CARE_PROVIDER_SITE_OTHER): Payer: Medicare Other

## 2022-11-08 ENCOUNTER — Ambulatory Visit
Admission: EM | Admit: 2022-11-08 | Discharge: 2022-11-08 | Disposition: A | Discharge status: Home / Self Care | Payer: Medicare Other | Attending: Family Medicine | Admitting: Family Medicine

## 2022-11-08 DIAGNOSIS — R059 Cough, unspecified: Secondary | ICD-10-CM

## 2022-11-08 DIAGNOSIS — J209 Acute bronchitis, unspecified: Secondary | ICD-10-CM | POA: Diagnosis not present

## 2022-11-08 MED ORDER — PROMETHAZINE-DM 6.25-15 MG/5ML PO SYRP: 5.0000 mL | ORAL_SOLUTION | Freq: Four times a day (QID) | ORAL | 0 refills | Status: DC | PRN

## 2022-11-08 MED ORDER — BENZONATATE 100 MG PO CAPS: 100.0000 mg | ORAL_CAPSULE | Freq: Three times a day (TID) | ORAL | 0 refills | Status: DC

## 2022-11-08 MED ORDER — CEFDINIR 300 MG PO CAPS: 300.0000 mg | ORAL_CAPSULE | Freq: Two times a day (BID) | ORAL | 0 refills | Status: AC

## 2022-11-08 MED ORDER — PREDNISONE 20 MG PO TABS: 40.0000 mg | ORAL_TABLET | Freq: Every day | ORAL | 0 refills | Status: AC

## 2022-11-08 NOTE — ED Provider Notes (Signed)
[facility]    CSN: [id number] Arrival date & time: 11/08/22  8101      History   Chief Complaint Chief Complaint  Patient presents with   Cough    HPI Roberta Hanson is a 82 y.o. female.   HPI  History obtained from {source of history:310783}.  presents for dry cough after having COVID at the end of August. She was not able to sleep last night due to the cough.  She has history of her lung collapse. No history of asthma and denies smoking history.  Has bad allergies this time a year. Has nasal congestion.  No fever.  Unable use the inhaler.  No shortness of breath.    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   Colon polyp    Degenerative disc disease, cervical 12/2013   Degenerative disc disease, cervical    Diverticulitis    Diverticulosis    Eosinophilia    Food intolerance    GERD (gastroesophageal reflux disease) 01/01/2014   High risk medication use    Hyperlipidemia, mixed    Hypertension    Irritable bowel syndrome    Irritable bowel syndrome    Mixed hyperlipidemia    Obesity (BMI 35.0-39.9 without comorbidity)    Osteoarthritis 01/01/2014   Seasonal allergies 01/01/2014    Patient Active Problem List   Diagnosis Date Noted   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   Primary osteoarthritis of right knee 12/23/2016   Bundle branch block, 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   Irritable bowel syndrome 07/04/2015   Tinnitus, bilateral 01/02/2015   Benign essential hypertension 01/01/2014   Degenerative disc disease, cervical 01/01/2014   Eosinophil count raised 01/01/2014    GERD (gastroesophageal reflux disease) 01/01/2014   Mixed hyperlipidemia 01/01/2014    Past Surgical History:  Procedure Laterality Date   BILATERAL CARPAL TUNNEL RELEASE     right 06/04/2006 and left 08/15/2007   COLON SURGERY     COLONOSCOPY     COLONOSCOPY WITH PROPOFOL N/A 08/24/2018   Procedure: COLONOSCOPY WITH PROPOFOL;  Surgeon: Toledo, Boykin Nearing, MD;  Location: [facility];  Service: Gastroenterology;  Laterality: N/A;   ESOPHAGOGASTRODUODENOSCOPY     HARDWARE REMOVAL Left 08/08/1974   removal of plate, left radius, bone graft, left ulna from left ilium   HEMICOLECTOMY Left 1992   due to diverticulitis   JOINT REPLACEMENT     ORIF DISTAL RADIUS FRACTURE Left 08/31/1973   compression plate and small bone plate   POLYPECTOMY     REVISION TOTAL HIP ARTHROPLASTY Left 1997   TOTAL HIP ARTHROPLASTY Left 1992   TOTAL HIP ARTHROPLASTY Right 2003   TOTAL HIP REVISION Left 07/14/2017   Procedure: TOTAL HIP REVISION;  Surgeon: Donato Heinz, MD;  Location: [facility] ORS;  Service: Orthopedics;  Laterality: Left;   UPPER GI ENDOSCOPY  2009   stretched esophagus    OB History   No obstetric history on file.      Home Medications    Prior to Admission medications   Medication Sig Start Date End Date Taking? Authorizing Provider  acetaminophen (TYLENOL 8 HOUR) 650 MG CR tablet Take 650 mg by mouth every 8 (eight) hours as needed for pain.   Yes [provider]  clidinium-chlordiazePOXIDE (LIBRAX) 5-2.5 MG capsule Take 1 capsule by mouth 4 (four) times daily -  before meals and at bedtime.   Yes [provider]  famotidine (PEPCID) 20 MG tablet Take 1 tablet (20 mg total) by mouth 2 (two) times daily. 04/11/22  Yes Becky Augusta, NP  fexofenadine (ALLEGRA) 180 MG tablet Take 180 mg by mouth daily.   Yes [provider]  Flaxseed Oil (LINSEED OIL) OIL Use   Yes [provider]  fluticasone (FLONASE) 50 MCG/ACT nasal spray Place 1 spray into both  nostrils as needed for allergies or rhinitis.   Yes [provider]  GNP GARLIC EXTRACT PO Take 2 capsules by mouth daily.   Yes [provider]  LACTOBACILLUS BIFIDUS PO Take 1 tablet by mouth daily.   Yes [provider]  Magnesium Oxide 500 MG TABS Take 1 tablet by mouth daily.   Yes [provider]  montelukast (SINGULAIR) 10 MG tablet Take 10 mg by mouth daily as needed.   Yes [provider]  Multiple Vitamins-Minerals (EQ COMPLETE MULTIVITAMIN-ADULT PO) Take 1 tablet by mouth daily.   Yes [provider]  Omega-3 Fatty Acids (OMEGA-3 FISH OIL PO) Take 1,040 mg by mouth daily.   Yes [provider]  pantoprazole (PROTONIX) 40 MG tablet Take 1 tablet by mouth daily. 01/08/17  Yes [provider]  potassium chloride (KLOR-CON) 10 MEQ tablet Take by mouth. 11/03/22  Yes [provider]  albuterol (PROVENTIL HFA;VENTOLIN HFA) 108 (90 Base) MCG/ACT inhaler Inhale 2 puffs into the lungs every 4 (four) hours as needed for wheezing. Patient not taking: Reported on 08/24/2018 04/10/18   Renford Dills, NP  benzonatate (TESSALON PERLES) 100 MG capsule Take 1 capsule (100 mg total) by mouth 3 (three) times daily as needed for cough. Patient not taking: Reported on 08/24/2018 04/10/18   Renford Dills, NP  chlorpheniramine-HYDROcodone [facility]) 10-8 MG/5ML SUER Take 3 mLs by mouth at bedtime as needed for cough. do not drive or operate machinery while taking as can cause drowsiness. Patient not taking: Reported on 08/24/2018 04/10/18   Renford Dills, NP  doxycycline (VIBRAMYCIN) 100 MG capsule Take 1 capsule (100 mg total) by mouth 2 (two) times daily. Patient not taking: Reported on 08/24/2018 04/10/18   Renford Dills, NP  losartan-hydrochlorothiazide [facility]) 50-12.5 MG tablet Take by mouth. 01/12/18 04/11/22  [provider]  meclizine (ANTIVERT) 25 MG tablet Take 1 tablet (25 mg total) by mouth 3 (three)  times daily as needed for dizziness. 10/07/20   Phineas Semen, MD  potassium chloride (K-DUR,KLOR-CON) 10 MEQ tablet Take 1 tablet by mouth daily. 07/12/17 04/11/22  [provider]  pravastatin (PRAVACHOL) 40 MG tablet Take by mouth. 01/13/18 04/11/22  [provider]  simethicone (GAS-X) 80 MG chewable tablet Chew 1 tablet (80 mg total) by mouth every 6 (six) hours as needed for flatulence. 04/11/22   Becky Augusta, NP    Family History Family History  Problem Relation Age of Onset   Hypertension Mother    Alzheimer's disease Mother    Heart attack Mother    Diabetes Father    Hypertension Father    Stroke Father    Breast cancer Cousin 40       mat cousin   Breast cancer Other 30   Lung cancer  Brother    Cancer Brother    Thyroid disease Sister    Hypertension Sister    Colonic polyp Sister    Colonic polyp Brother    Diverticulitis Brother     Social History Social History   Tobacco Use   Smoking status: Never   Smokeless tobacco: Never  Vaping Use   Vaping status: Never Used  Substance Use Topics   Alcohol use: Not Currently   Drug use: Never     Allergies   Sulfa antibiotics, Lisinopril, Citalopram, and Metronidazole   Review of Systems Review of Systems: negative unless otherwise stated in HPI.      Physical Exam Triage Vital Signs ED Triage Vitals  Encounter Vitals Group     BP 11/08/22 0930 (!) 147/81     Systolic BP Percentile --      Diastolic BP Percentile --      Pulse Rate 11/08/22 0930 85     Resp 11/08/22 0930 16     Temp 11/08/22 0930 97.8 F (36.6 C)     Temp Source 11/08/22 0930 Oral     SpO2 11/08/22 0930 99 %     Weight 11/08/22 0929 177 lb (80.3 kg)     Height 11/08/22 0929 5\' 2"  (1.575 m)     Head Circumference --      Peak Flow --      Pain Score 11/08/22 0933 0     Pain Loc --      Pain Education --      Exclude from Growth Chart --    No data found.  Updated Vital Signs BP (!) 147/81 (BP Location: Left  Arm)   Pulse 85   Temp 97.8 F (36.6 C) (Oral)   Resp 16   Ht 5\' 2"  (1.575 m)   Wt 80.3 kg   SpO2 99%   BMI 32.37 kg/m   Visual Acuity Right Eye Distance:   Left Eye Distance:   Bilateral Distance:    Right Eye Near:   Left Eye Near:    Bilateral Near:     Physical Exam GEN:     alert, non-toxic appearing female in no distress ***   HENT:  mucus membranes moist, oropharyngeal ***without lesions or ***erythema, no*** tonsillar hypertrophy or exudates, *** moderate erythematous edematous turbinates, ***clear nasal discharge, ***bilateral TM normal EYES:   pupils equal and reactive, ***no scleral injection or discharge NECK:  normal ROM, no ***lymphadenopathy, ***no meningismus   RESP:  no increased work of breathing, ***clear to auscultation bilaterally CVS:   regular rate ***and rhythm Skin:   warm and dry, no rash on visible skin***    UC Treatments / Results  Labs (all labs ordered are listed, but only abnormal results are displayed) Labs Reviewed - No data to display  EKG   Radiology No results found.  Procedures Procedures (including critical care time)  Medications Ordered in UC Medications - No data to display  Initial Impression / Assessment and Plan / UC Course  I have reviewed the triage vital signs and the nursing notes.  Pertinent labs & imaging results that were available during my care of the patient were reviewed by me and considered in my medical decision making (see chart for details).       Pt is a 82 y.o. female who presents for *** days of respiratory symptoms. Lilyth is ***afebrile here without recent antipyretics. Satting well on room air. Overall pt is ***non-toxic appearing, well hydrated, without respiratory  distress. Pulmonary exam ***is unremarkable.  COVID testing obtained ***and was negative. ***Pt to quarantine until COVID test results or longer if positive.  I will call patient with test results, if positive. History consistent  with ***viral respiratory illness. Discussed symptomatic treatment.  Explained lack of efficacy of antibiotics in viral disease.  Typical duration of symptoms discussed.   Return and ED precautions given and voiced understanding. Discussed MDM, treatment plan and plan for follow-up with patient*** who agrees with plan.     Final Clinical Impressions(s) / UC Diagnoses   Final diagnoses:  None   Discharge Instructions   None    ED Prescriptions   None    PDMP not reviewed this encounter.

## 2022-11-08 NOTE — ED Triage Notes (Signed)
Pt c/o cough x1 wk. Denies any wheezing or SOB. No otc tx. States tested + for covid 2 wks ago.

## 2022-11-08 NOTE — Discharge Instructions (Addendum)
Your chest x-ray did not show any evidence of pneumonia.  I will treat you for bronchitis.  Take prednisone with food as early in the day as possible.  Stop by the pharmacy to pick up your steroids/prednisone and your antibiotics/cefdinir.  Use your inhaler as discussed.  I also sent some medications for cough.

## 2022-11-18 ENCOUNTER — Other Ambulatory Visit: Payer: Self-pay

## 2022-11-18 ENCOUNTER — Encounter
Admission: RE | Admit: 2022-11-18 | Discharge: 2022-11-18 | Disposition: A | Discharge status: Home / Self Care | Payer: Medicare Other | Source: Ambulatory Visit | Attending: Orthopedic Surgery | Admitting: Orthopedic Surgery

## 2022-11-18 DIAGNOSIS — Z01818 Encounter for other preprocedural examination: Secondary | ICD-10-CM | POA: Diagnosis present

## 2022-11-18 DIAGNOSIS — M1711 Unilateral primary osteoarthritis, right knee: Secondary | ICD-10-CM | POA: Diagnosis not present

## 2022-11-18 DIAGNOSIS — Z01812 Encounter for preprocedural laboratory examination: Secondary | ICD-10-CM

## 2022-11-18 DIAGNOSIS — I447 Left bundle-branch block, unspecified: Secondary | ICD-10-CM | POA: Insufficient documentation

## 2022-11-18 DIAGNOSIS — I10 Essential (primary) hypertension: Secondary | ICD-10-CM | POA: Insufficient documentation

## 2022-11-18 DIAGNOSIS — Z01810 Encounter for preprocedural cardiovascular examination: Secondary | ICD-10-CM | POA: Diagnosis not present

## 2022-11-18 HISTORY — DX: Unilateral primary osteoarthritis, right knee: M17.11

## 2022-11-18 HISTORY — DX: Essential (primary) hypertension: I10

## 2022-11-18 LAB — CBC
HCT: 37.8 % (ref 36.0–46.0)
Hemoglobin: 12 g/dL (ref 12.0–15.0)
MCH: 23 pg — ABNORMAL LOW (ref 26.0–34.0)
MCHC: 31.7 g/dL (ref 30.0–36.0)
MCV: 72.6 fL — ABNORMAL LOW (ref 80.0–100.0)
Platelets: 251 K/uL (ref 150–400)
RBC: 5.21 MIL/uL — ABNORMAL HIGH (ref 3.87–5.11)
RDW: 15.1 % (ref 11.5–15.5)
WBC: 8.3 K/uL (ref 4.0–10.5)
nRBC: 0 % (ref 0.0–0.2)

## 2022-11-18 LAB — URINALYSIS, ROUTINE W REFLEX MICROSCOPIC
Bilirubin Urine: NEGATIVE
Glucose, UA: NEGATIVE mg/dL
Hgb urine dipstick: NEGATIVE
Ketones, ur: NEGATIVE mg/dL
Leukocytes,Ua: NEGATIVE
Nitrite: NEGATIVE
Protein, ur: NEGATIVE mg/dL
Specific Gravity, Urine: 1.004 — ABNORMAL LOW (ref 1.005–1.030)
pH: 8 (ref 5.0–8.0)

## 2022-11-18 LAB — COMPREHENSIVE METABOLIC PANEL WITH GFR
ALT: 21 U/L (ref 0–44)
AST: 24 U/L (ref 15–41)
Albumin: 4.5 g/dL (ref 3.5–5.0)
Alkaline Phosphatase: 59 U/L (ref 38–126)
Anion gap: 9 (ref 5–15)
BUN: 7 mg/dL — ABNORMAL LOW (ref 8–23)
CO2: 27 mmol/L (ref 22–32)
Calcium: 9.5 mg/dL (ref 8.9–10.3)
Chloride: 99 mmol/L (ref 98–111)
Creatinine, Ser: 0.74 mg/dL (ref 0.44–1.00)
GFR, Estimated: >60 mL/min (ref >60)
Glucose, Bld: 107 mg/dL — ABNORMAL HIGH (ref 70–99)
Potassium: 3.3 mmol/L — ABNORMAL LOW (ref 3.5–5.1)
Sodium: 135 mmol/L (ref 135–145)
Total Bilirubin: 1.8 mg/dL — ABNORMAL HIGH (ref 0.3–1.2)
Total Protein: 7.5 g/dL (ref 6.5–8.1)

## 2022-11-18 LAB — TYPE AND SCREEN
ABO/RH(D): O POS
Antibody Screen: NEGATIVE

## 2022-11-18 LAB — C-REACTIVE PROTEIN: CRP: 0.5 mg/dL

## 2022-11-18 LAB — SURGICAL PCR SCREEN
MRSA, PCR: NEGATIVE
Staphylococcus aureus: NEGATIVE

## 2022-11-18 LAB — SEDIMENTATION RATE: Sed Rate: 8 mm/h (ref 0–30)

## 2022-11-18 NOTE — Patient Instructions (Signed)
Your procedure is scheduled on: Wednesday, September 25 Report to the Registration Desk on the 1st floor of the [facility] To find out your arrival time, please call [phone] between 1PM - 3PM on: Tuesday, September 24 If your arrival time is 6:00 am, do not arrive before that time as the [facility] entrance doors do not open until 6:00 am.  REMEMBER: Instructions that are not followed completely may result in serious medical risk, up to and including death; or upon the discretion of your surgeon and anesthesiologist your surgery may need to be rescheduled.  Do not eat food after midnight the night before surgery.  No gum chewing or hard candies.  You may however, drink CLEAR liquids up to 2 hours before you are scheduled to arrive for your surgery. Do not drink anything within 2 hours of your scheduled arrival time.  Clear liquids include: - water  - apple juice without pulp - gatorade (not RED colors) - black coffee or tea (Do NOT add milk or creamers to the coffee or tea) Do NOT drink anything that is not on this list.  In addition, your doctor has ordered for you to drink the provided:  Ensure Pre-Surgery Clear Carbohydrate Drink  Drinking this carbohydrate drink up to two hours before surgery helps to reduce insulin resistance and improve patient outcomes. Please complete drinking 2 hours before scheduled arrival time.  One week prior to surgery: starting today, September 18 Stop meloxicam and Anti-inflammatories (NSAIDS) such as Advil, Aleve, Ibuprofen, Motrin, Naproxen, Naprosyn and Aspirin based products such as Excedrin, Goody's Powder, BC Powder. Stop ANY OVER THE COUNTER supplements until after surgery. Stop flaxseed oil, garlique, magnesium glycinate, beet root, emergen-C, peppermint oil, probiotic. You may however, continue to take Tylenol if needed for pain up until the day of surgery.  Continue taking all prescribed medications   TAKE ONLY THESE MEDICATIONS  THE MORNING OF SURGERY WITH A SIP OF WATER:  Potassium Pantoprazole (Protonix) - (take one the night before and one on the morning of surgery - helps to prevent nausea after surgery.)  No Alcohol for 24 hours before or after surgery.  On the morning of surgery brush your teeth with toothpaste and water, you may rinse your mouth with mouthwash if you wish. Do not swallow any toothpaste or mouthwash.  Use CHG Soap as directed on instruction sheet.  Do not wear jewelry, make-up, hairpins, clips or nail polish.  For welded (permanent) jewelry: bracelets, anklets, waist bands, etc.  Please have this removed prior to surgery.  If it is not removed, there is a chance that hospital personnel will need to cut it off on the day of surgery.  Do not wear lotions, powders, or perfumes.   Do not shave body hair from the neck down 48 hours before surgery.  Contact lenses, hearing aids and dentures may not be worn into surgery.  Do not bring valuables to the hospital. [facility] is not responsible for any missing/lost belongings or valuables.   Notify your doctor if there is any change in your medical condition (cold, fever, infection).  Wear comfortable clothing (specific to your surgery type) to the hospital.  After surgery, you can help prevent lung complications by doing breathing exercises.  Take deep breaths and cough every 1-2 hours. Your doctor may order a device called an Incentive Spirometer to help you take deep breaths.  If you are being admitted to the hospital overnight, leave your suitcase in the car. After surgery  it may be brought to your room.  In case of increased patient census, it may be necessary for you, the patient, to continue your postoperative care in the Same Day Surgery department.  If you are being discharged the day of surgery, you will not be allowed to drive home. You will need a responsible individual to drive you home and stay with you for 24 hours after  surgery.   If you are taking public transportation, you will need to have a responsible individual with you.  Please call the Pre-admissions Testing Dept. at [phone] if you have any questions about these instructions.  Surgery Visitation Policy:  Patients having surgery or a procedure may have two visitors.  Children under the age of 55 must have an adult with them who is not the patient.  Inpatient Visitation:    Visiting hours are 7 a.m. to 8 p.m. Up to four visitors are allowed at one time in a patient room. The visitors may rotate out with other people during the day.  One visitor age 5 or older may stay with the patient overnight and must be in the room by 8 p.m.    Pre-operative 5 CHG Bath Instructions   You can play a key role in reducing the risk of infection after surgery. Your skin needs to be as free of germs as possible. You can reduce the number of germs on your skin by washing with CHG (chlorhexidine gluconate) soap before surgery. CHG is an antiseptic soap that kills germs and continues to kill germs even after washing.   DO NOT use if you have an allergy to chlorhexidine/CHG or antibacterial soaps. If your skin becomes reddened or irritated, stop using the CHG and notify one of our RNs at [phone].   Please shower with the CHG soap starting 4 days before surgery using the following schedule:     Please keep in mind the following:  DO NOT shave, including legs and underarms, starting the day of your first shower.   You may shave your face at any point before/day of surgery.  Place clean sheets on your bed the day you start using CHG soap. Use a clean washcloth (not used since being washed) for each shower. DO NOT sleep with pets once you start using the CHG.   CHG Shower Instructions:  If you choose to wash your hair and private area, wash first with your normal shampoo/soap.  After you use shampoo/soap, rinse your hair and body thoroughly to remove  shampoo/soap residue.  Turn the water OFF and apply about 3 tablespoons (45 ml) of CHG soap to a CLEAN washcloth.  Apply CHG soap ONLY FROM YOUR NECK DOWN TO YOUR TOES (washing for 3-5 minutes)  DO NOT use CHG soap on face, private areas, open wounds, or sores.  Pay special attention to the area where your surgery is being performed.  If you are having back surgery, having someone wash your back for you may be helpful. Wait 2 minutes after CHG soap is applied, then you may rinse off the CHG soap.  Pat dry with a clean towel  Put on clean clothes/pajamas   If you choose to wear lotion, please use ONLY the CHG-compatible lotions on the back of this paper.     Additional instructions for the day of surgery: DO NOT APPLY any lotions, deodorants, cologne, or perfumes.   Put on clean/comfortable clothes.  Brush your teeth.  Ask your nurse before applying any prescription medications  to the skin.      CHG Compatible Lotions   Aveeno Moisturizing lotion  Cetaphil Moisturizing Cream  Cetaphil Moisturizing Lotion  Clairol Herbal Essence Moisturizing Lotion, Dry Skin  Clairol Herbal Essence Moisturizing Lotion, Extra Dry Skin  Clairol Herbal Essence Moisturizing Lotion, Normal Skin  Curel Age Defying Therapeutic Moisturizing Lotion with Alpha Hydroxy  Curel Extreme Care Body Lotion  Curel Soothing Hands Moisturizing Hand Lotion  Curel Therapeutic Moisturizing Cream, Fragrance-Free  Curel Therapeutic Moisturizing Lotion, Fragrance-Free  Curel Therapeutic Moisturizing Lotion, Original Formula  Eucerin Daily Replenishing Lotion  Eucerin Dry Skin Therapy Plus Alpha Hydroxy Crme  Eucerin Dry Skin Therapy Plus Alpha Hydroxy Lotion  Eucerin Original Crme  Eucerin Original Lotion  Eucerin Plus Crme Eucerin Plus Lotion  Eucerin TriLipid Replenishing Lotion  Keri Anti-Bacterial Hand Lotion  Keri Deep Conditioning Original Lotion Dry Skin Formula Softly Scented  Keri Deep Conditioning  Original Lotion, Fragrance Free Sensitive Skin Formula  Keri Lotion Fast Absorbing Fragrance Free Sensitive Skin Formula  Keri Lotion Fast Absorbing Softly Scented Dry Skin Formula  Keri Original Lotion  Keri Skin Renewal Lotion Keri Silky Smooth Lotion  Keri Silky Smooth Sensitive Skin Lotion  Nivea Body Creamy Conditioning Oil  Nivea Body Extra Enriched Lotion  Nivea Body Original Lotion  Nivea Body Sheer Moisturizing Lotion Nivea Crme  Nivea Skin Firming Lotion  NutraDerm 30 Skin Lotion  NutraDerm Skin Lotion  NutraDerm Therapeutic Skin Cream  NutraDerm Therapeutic Skin Lotion  ProShield Protective Hand Cream  Provon moisturizing lotion  Preoperative Educational Videos for Total Hip, Knee and Shoulder Replacements  To better prepare for surgery, please view our videos that explain the physical activity and discharge planning required to have the best surgical recovery at [facility].  [email or web address]  Questions? Call [phone] or email [email or web address]

## 2022-11-23 NOTE — H&P (Signed)
ORTHOPAEDIC HISTORY & PHYSICAL Roberta Maudlin, PA - 11/23/2022 9:30 AM EDT Formatting of this note is different from the original. Images from the original note were not included. Chief Complaint Chief Complaint Patient presents with Knee Pain H & P RIGHT KNEE  Reason for Visit Roberta Hanson is a 82 y.o. who presents today for history and physical. She is to undergo a right total knee arthroplasty on 11/25/2022. Since her last visit here to clinic there is been no improvement in her condition. The patient expresses her desire to proceed with surgery.  She has a long history of right knee pain. She localizes most of the pain along the lateral aspect of the knee. She reports some swelling, no locking, and some giving way of the knee. The pain is aggravated by any weight bearing. The knee pain limits the patient's ability to ambulate long distances.The patient has not appreciated any significant improvement despite Tylenol, NSAIDs, activity modification, intra-articular corticosteroid injections, viscosupplementation, and ambulatory aids. She is using a cane for ambulation. The patient states that the knee pain has progressed to the point that it is significantly interfering with her activities of daily living.   Past Medical History Past Medical History: Diagnosis Date Anemia Bundle branch block, 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 Chronic at least since 2009 Colon polyp Degenerative disc disease, cervical 01/01/2014 Diverticulitis Diverticulosis Eosinophilia 01/01/2014 Essential hypertension, benign 01/01/2014 Food intolerance GERD (gastroesophageal reflux disease) 01/01/2014 High risk medication use 01/08/2016 Valsartan HCT Irritable bowel syndrome Mixed hyperlipidemia 01/01/2014 Obesity (BMI 35.0-39.9 without comorbidity), unspecified 12/23/2016 Osteoarthritis 01/01/2014 Seasonal allergies 01/01/2014  Past Surgical History Past Surgical History: Procedure Laterality Date ORIF of left radius with a  compression plate and small bone plate 65/78/4696 Dr Lujean Rave Removal of plate, left radius, bone graft, left ulna from left ilium 08/08/1974 Dr Lujean Rave Removal of plate and screws, left ulna, axillary block 07/24/1975 Dr Lujean Rave left hemicolectomy 1992 colon surgery due to ruptured diverticuli Left AML cementless total hip replacement (46 mm, 12.0 mm femur, 11 mm neck 07/04/1991 Dr Deeann Saint REVISION TOTAL HIP ARTHROPLASTY Left 1997 Right cement less ANL total hip replacement (48-mm cup/13.5-mm MMA femoral stem/plus 8.5-mm ball). 06/28/2001 Dr Deeann Saint ENDOSCOPIC CARPAL TUNNEL RELEASE Right 06/04/2006 Open release median nerve right wrist Open release median nerve, right wrist 06/08/2006 Dr Ruthann Cancer EGD 05/23/2007 ENDOSCOPIC CARPAL TUNNEL RELEASE Left 08/15/2007 Open release median nerve left wrist; Open release right long trigger finger Open release median nerve left wrist; Open release right long trigger finger 08/15/2007 Dr Ruthann Cancer Left hip revision (polyethylene exchange and femoral head exchange), debridement of osteolytic lesion from the proximal femur, and allograft bone grafting of the proximal left femur 07/14/2017 Dr Ernest Pine COLONOSCOPY 08/24/2018 Sessile Serrated polyp/ FHx CP-Brother & Sister/No repeat due to age/TKT COLONOSCOPY 02/09/2003 DKS COLONOSCOPY 08/06/2008 RTE REPEAT 5 YRS COLONOSCOPY 09/12/2013 PYO FHx of CP(Brother & Sister)/Div-[place] & DC/Poor prep/Repeat 75yrs/[place] ESOPHAGUS SURGERY POLYPECTOMY SMALL INTESTINE SURGERY UPPER GASTROINTESTINAL ENDOSCOPY  Past Family History Family History Problem Relation Age of Onset Myocardial Infarction (Heart attack) Mother High blood pressure (Hypertension) Mother Stroke Mother Alzheimer's disease Mother Diabetes Father High blood pressure (Hypertension) Father Stroke Father Thyroid disease Sister High blood pressure (Hypertension) Sister Ovarian cancer Sister Colonic polyp Sister High blood  pressure (Hypertension) Sister High blood pressure (Hypertension) Sister Diabetes type II Sister No Known Problems Sister Uterine cancer Sister Colonic polyp Brother Diverticulitis Brother No Known Problems Brother No Known Problems Brother Heart disease Brother Hepatitis Brother No Known Problems  Brother Breast cancer Cousin Breast cancer Cousin  Medications Current Outpatient Medications Medication Sig Dispense Refill ascorbic acid-multivit-min (EMERGEN-C IMMUNE PLUS) 1,000 mg PwEP Take 1 packet by mouth every morning clidinium-chlordiazePOXIDE (LIBRAX) 5-2.5 mg capsule Take 1 capsule by mouth 3 (three) times daily as needed 15 capsule 0 fexofenadine (ALLEGRA) 180 MG tablet Take 1 tablet (180 mg total) by mouth once daily as needed flaxseed oiL Oil Use 1,200 mg once daily fluticasone propionate (FLONASE) 50 mcg/actuation nasal spray SHAKE LIQUID AND USE 1 TO 2 SPRAYS IN EACH NOSTRIL EVERY DAY AS NEEDED FOR ALLERGIES 48 g 1 garlic (GARLIQUE ORAL) Take 1 tablet by mouth once daily lactobacillus combo no.6 (PROBIOTIC COMPLEX ORAL) Take 1 capsule by mouth once daily. ([facility] brand) levocetirizine (XYZAL) 5 MG tablet Take 5 mg by mouth once daily as needed for Allergies losartan-hydroCHLOROthiazide (HYZAAR) 50-12.5 mg tablet Take 1 tablet by mouth once daily for Blood Pressure 90 tablet 0 magnesium 200 mg Take 1 tablet by mouth 2 (two) times daily meloxicam (MOBIC) 15 MG tablet Take 15 mg by mouth once daily as needed for Pain multivitamin capsule Take 2 capsules by mouth once daily pantoprazole (PROTONIX) 40 MG DR tablet TAKE 1 TABLET(40 MG) BY MOUTH EVERY DAY 90 tablet 3 peppermint oiL 90 mg CECX Take 1 capsule by mouth once daily as needed potassium chloride (KLOR-CON) 10 MEQ ER tablet Take 1 tablet (10 mEq total) by mouth 2 (two) times daily 180 tablet 3 pravastatin (PRAVACHOL) 40 MG tablet TAKE 1 TABLET(40 MG) BY MOUTH AT BEDTIME FOR CHOLESTEROL 90 tablet  1 acetaminophen (TYLENOL) 650 MG ER tablet Take 650-1,350 mg by mouth every 8 (eight) hours as needed for Pain. (Patient not taking: Reported on 11/23/2022) meloxicam (MOBIC) 15 MG tablet  No current facility-administered medications for this visit.  Allergies Allergies Allergen Reactions Lisinopril Cough Sulfa (Sulfonamide Antibiotics) Rash As a child Citalopram Other (See Comments) Feels aggitated Metronidazole Diarrhea   Review of Systems A comprehensive 14 point ROS was performed, reviewed, and the pertinent orthopaedic findings are documented in the HPI.  Exam BP (!) 130/90 (BP Location: Left upper arm, Patient Position: Sitting, BP Cuff Size: Adult)  Ht 154.9 cm (5\' 1" )  Wt 78.4 kg (172 lb 12.8 oz)  BMI 32.65 kg/m  General: Well-developed well-nourished female seen in no acute distress.  HEENT: Atraumatic,normocephalic. Pupils are equal and reactive to light. Oropharynx is clear with moist mucosa  Lungs: Clear to auscultation bilaterally  Cardiovascular: Regular rate and rhythm. Normal S1, S2. 2/ 6 systolic murmur. No appreciable gallops or rubs. Peripheral pulses are palpable.  Abdomen: Soft, non-tender, nondistended. Bowel sounds present  Extremity: Right Knee: Soft tissue swelling: mild Effusion: minimal Erythema: none Crepitance: mild Tenderness: lateral Alignment: relative valgus Mediolateral laxity: lateral pseudolaxity Posterior sag: negative Patellar tracking: Good tracking without evidence of subluxation or tilt Atrophy: No significant atrophy. Quadriceps tone was fair to good. Range of motion: 0/3/113 degrees  Neurological:  The patient is alert and oriented Sensation to light touch appears to be intact and within normal limits Gross motor strength appeared to be equal to 5/5  Vascular :  Peripheral pulses felt to be palpable. Capillary refill appears to be intact and within normal limits  X-ray  1. X-rays taken in [place] [facility]  on 08/18/2022 showed significant narrowing of the lateral cartilage space with some increased valgus deformity. She was noted to have osteophytes as well as subchondral sclerosis. No evidence of any fractures or dislocations.  Impression  1.  Degenerative arthrosis right knee  Plan  1. I have gone over the patient's medication on today's visit 2. Past medical history reviewed 3. Postop rehab course discussed 4. Return to clinic 2 weeks postop  This note was generated in part with voice recognition software and I apologize for any typographical errors that were not detected and corrected   Tera Partridge PA Electronically signed by Roberta Maudlin, PA at 11/23/2022 9:52 AM EDT

## 2022-11-24 MED ORDER — CELECOXIB 200 MG PO CAPS
400.0000 mg | ORAL_CAPSULE | Freq: Once | ORAL | Status: AC
  Administered 2022-11-25: 400 mg via ORAL

## 2022-11-24 MED ORDER — TRANEXAMIC ACID-NACL 1000-0.7 MG/100ML-% IV SOLN
1000.0000 mg | INTRAVENOUS | Status: AC
  Administered 2022-11-25: 1000 mg via INTRAVENOUS

## 2022-11-24 MED ORDER — GABAPENTIN 300 MG PO CAPS
300.0000 mg | ORAL_CAPSULE | Freq: Once | ORAL | Status: AC
  Administered 2022-11-25: 300 mg via ORAL

## 2022-11-24 MED ORDER — LACTATED RINGERS IV SOLN: INTRAVENOUS | Status: DC

## 2022-11-24 MED ORDER — ORAL CARE MOUTH RINSE: 15.0000 mL | Freq: Once | OROMUCOSAL | Status: AC

## 2022-11-24 MED ORDER — CEFAZOLIN SODIUM-DEXTROSE 2-4 GM/100ML-% IV SOLN
2.0000 g | INTRAVENOUS | Status: AC
  Administered 2022-11-25: 2 g via INTRAVENOUS

## 2022-11-24 MED ORDER — DEXAMETHASONE SODIUM PHOSPHATE 10 MG/ML IJ SOLN
8.0000 mg | Freq: Once | INTRAMUSCULAR | Status: AC
  Administered 2022-11-25: 8 mg via INTRAVENOUS

## 2022-11-24 MED ORDER — CHLORHEXIDINE GLUCONATE 0.12 % MT SOLN
15.0000 mL | Freq: Once | OROMUCOSAL | Status: AC
  Administered 2022-11-25: 15 mL via OROMUCOSAL

## 2022-11-24 MED ORDER — CHLORHEXIDINE GLUCONATE 4 % EX SOLN: 60.0000 mL | Freq: Once | CUTANEOUS | Status: DC

## 2022-11-25 ENCOUNTER — Observation Stay
Admission: RE | Admit: 2022-11-25 | Discharge: 2022-11-26 | Disposition: A | Discharge status: Home / Self Care | Payer: Medicare Other | Attending: Orthopedic Surgery | Admitting: Orthopedic Surgery

## 2022-11-25 ENCOUNTER — Other Ambulatory Visit: Payer: Self-pay

## 2022-11-25 ENCOUNTER — Encounter: Payer: Self-pay | Admitting: Orthopedic Surgery

## 2022-11-25 ENCOUNTER — Observation Stay: Payer: Medicare Other

## 2022-11-25 ENCOUNTER — Ambulatory Visit: Payer: Medicare Other | Admitting: Certified Registered"

## 2022-11-25 ENCOUNTER — Ambulatory Visit: Payer: Medicare Other | Admitting: Urgent Care

## 2022-11-25 ENCOUNTER — Encounter
Admission: RE | Disposition: A | Discharge status: Home / Self Care | Payer: Self-pay | Source: Home / Self Care | Attending: Orthopedic Surgery

## 2022-11-25 DIAGNOSIS — I447 Left bundle-branch block, unspecified: Secondary | ICD-10-CM

## 2022-11-25 DIAGNOSIS — M1711 Unilateral primary osteoarthritis, right knee: Principal | ICD-10-CM | POA: Insufficient documentation

## 2022-11-25 DIAGNOSIS — Z79899 Other long term (current) drug therapy: Secondary | ICD-10-CM | POA: Diagnosis not present

## 2022-11-25 DIAGNOSIS — Z01812 Encounter for preprocedural laboratory examination: Secondary | ICD-10-CM

## 2022-11-25 DIAGNOSIS — Z96642 Presence of left artificial hip joint: Secondary | ICD-10-CM | POA: Insufficient documentation

## 2022-11-25 DIAGNOSIS — I10 Essential (primary) hypertension: Secondary | ICD-10-CM | POA: Diagnosis not present

## 2022-11-25 DIAGNOSIS — Z96659 Presence of unspecified artificial knee joint: Secondary | ICD-10-CM

## 2022-11-25 HISTORY — PX: KNEE ARTHROPLASTY: SHX992

## 2022-11-25 SURGERY — ARTHROPLASTY, KNEE, TOTAL, USING IMAGELESS COMPUTER-ASSISTED NAVIGATION
Anesthesia: General | Site: Knee | Laterality: Right

## 2022-11-25 MED ORDER — CEFAZOLIN SODIUM-DEXTROSE 2-4 GM/100ML-% IV SOLN
2.0000 g | Freq: Four times a day (QID) | INTRAVENOUS | Status: AC
  Administered 2022-11-25 (×2): 2 g via INTRAVENOUS

## 2022-11-25 MED ORDER — POTASSIUM CHLORIDE CRYS ER 20 MEQ PO TBCR
10.0000 meq | EXTENDED_RELEASE_TABLET | Freq: Two times a day (BID) | ORAL | Status: DC
  Administered 2022-11-25 – 2022-11-26 (×2): 10 meq via ORAL

## 2022-11-25 MED ORDER — SENNOSIDES-DOCUSATE SODIUM 8.6-50 MG PO TABS
1.0000 | ORAL_TABLET | Freq: Two times a day (BID) | ORAL | Status: DC
  Administered 2022-11-25 – 2022-11-26 (×2): 1 via ORAL

## 2022-11-25 MED ORDER — TRANEXAMIC ACID-NACL 1000-0.7 MG/100ML-% IV SOLN
1000.0000 mg | Freq: Once | INTRAVENOUS | Status: AC
  Administered 2022-11-25: 1000 mg via INTRAVENOUS

## 2022-11-25 MED ORDER — LACTATED RINGERS IV SOLN: INTRAVENOUS | Status: DC

## 2022-11-25 MED ORDER — CELECOXIB 200 MG PO CAPS
200.0000 mg | ORAL_CAPSULE | Freq: Two times a day (BID) | ORAL | Status: DC
  Administered 2022-11-25 – 2022-11-26 (×3): 200 mg via ORAL
  Filled 2022-11-25 (×3): qty 1

## 2022-11-25 MED ORDER — PROPOFOL 1000 MG/100ML IV EMUL
INTRAVENOUS | Status: AC
  Filled 2022-11-25: qty 100

## 2022-11-25 MED ORDER — OXYCODONE HCL 5 MG PO TABS
ORAL_TABLET | ORAL | Status: AC
  Filled 2022-11-25: qty 1

## 2022-11-25 MED ORDER — TRAMADOL HCL 50 MG PO TABS
ORAL_TABLET | ORAL | Status: AC
  Filled 2022-11-25: qty 1

## 2022-11-25 MED ORDER — ALUM & MAG HYDROXIDE-SIMETH 200-200-20 MG/5ML PO SUSP: 30.0000 mL | ORAL | Status: DC | PRN

## 2022-11-25 MED ORDER — METOCLOPRAMIDE HCL 10 MG PO TABS
ORAL_TABLET | ORAL | Status: AC
  Filled 2022-11-25: qty 1

## 2022-11-25 MED ORDER — FLUTICASONE PROPIONATE 50 MCG/ACT NA SUSP: 1.0000 | NASAL | Status: DC | PRN

## 2022-11-25 MED ORDER — SODIUM CHLORIDE 0.9 % IR SOLN
Status: DC | PRN
  Administered 2022-11-25: 3000 mL

## 2022-11-25 MED ORDER — BUPIVACAINE HCL (PF) 0.25 % IJ SOLN
INTRAMUSCULAR | Status: AC
  Filled 2022-11-25: qty 120

## 2022-11-25 MED ORDER — CEFAZOLIN SODIUM-DEXTROSE 2-4 GM/100ML-% IV SOLN
INTRAVENOUS | Status: AC
  Filled 2022-11-25: qty 100

## 2022-11-25 MED ORDER — ACETAMINOPHEN 10 MG/ML IV SOLN
INTRAVENOUS | Status: DC | PRN
  Administered 2022-11-25: 1000 mg via INTRAVENOUS

## 2022-11-25 MED ORDER — PHENOL 1.4 % MT LIQD: 1.0000 | OROMUCOSAL | Status: DC | PRN

## 2022-11-25 MED ORDER — CHLORHEXIDINE GLUCONATE 0.12 % MT SOLN
OROMUCOSAL | Status: AC
  Filled 2022-11-25: qty 15

## 2022-11-25 MED ORDER — ONDANSETRON HCL 4 MG/2ML IJ SOLN: 4.0000 mg | Freq: Once | INTRAMUSCULAR | Status: DC | PRN

## 2022-11-25 MED ORDER — BUPIVACAINE LIPOSOME 1.3 % IJ SUSP
INTRAMUSCULAR | Status: AC
  Filled 2022-11-25: qty 40

## 2022-11-25 MED ORDER — DIPHENHYDRAMINE HCL 12.5 MG/5ML PO ELIX: 12.5000 mg | ORAL_SOLUTION | ORAL | Status: DC | PRN

## 2022-11-25 MED ORDER — OXYCODONE HCL 5 MG PO TABS: 10.0000 mg | ORAL_TABLET | ORAL | Status: DC | PRN

## 2022-11-25 MED ORDER — FERROUS SULFATE 325 (65 FE) MG PO TABS
325.0000 mg | ORAL_TABLET | Freq: Two times a day (BID) | ORAL | Status: DC
  Administered 2022-11-25 – 2022-11-26 (×2): 325 mg via ORAL

## 2022-11-25 MED ORDER — BUPIVACAINE HCL (PF) 0.5 % IJ SOLN
INTRAMUSCULAR | Status: DC | PRN
  Administered 2022-11-25: 3 mL

## 2022-11-25 MED ORDER — POTASSIUM CHLORIDE CRYS ER 20 MEQ PO TBCR
EXTENDED_RELEASE_TABLET | ORAL | Status: AC
  Filled 2022-11-25: qty 1

## 2022-11-25 MED ORDER — ASPIRIN 81 MG PO CHEW
81.0000 mg | CHEWABLE_TABLET | Freq: Two times a day (BID) | ORAL | Status: DC
  Administered 2022-11-25 – 2022-11-26 (×2): 81 mg via ORAL

## 2022-11-25 MED ORDER — SODIUM CHLORIDE FLUSH 0.9 % IV SOLN
INTRAVENOUS | Status: AC
  Filled 2022-11-25: qty 40

## 2022-11-25 MED ORDER — HYDROCHLOROTHIAZIDE 12.5 MG PO TABS
12.5000 mg | ORAL_TABLET | Freq: Every day | ORAL | Status: DC
  Administered 2022-11-26: 12.5 mg via ORAL
  Filled 2022-11-25: qty 1

## 2022-11-25 MED ORDER — TRANEXAMIC ACID-NACL 1000-0.7 MG/100ML-% IV SOLN
INTRAVENOUS | Status: AC
  Filled 2022-11-25: qty 100

## 2022-11-25 MED ORDER — PRAVASTATIN SODIUM 40 MG PO TABS
ORAL_TABLET | ORAL | Status: AC
  Filled 2022-11-25: qty 1

## 2022-11-25 MED ORDER — BUPIVACAINE HCL (PF) 0.5 % IJ SOLN
INTRAMUSCULAR | Status: AC
  Filled 2022-11-25: qty 10

## 2022-11-25 MED ORDER — RISAQUAD PO CAPS
1.0000 | ORAL_CAPSULE | Freq: Every day | ORAL | Status: DC
  Administered 2022-11-26: 1 via ORAL
  Filled 2022-11-25: qty 1

## 2022-11-25 MED ORDER — PANTOPRAZOLE SODIUM 40 MG PO TBEC
DELAYED_RELEASE_TABLET | ORAL | Status: AC
  Filled 2022-11-25: qty 1

## 2022-11-25 MED ORDER — SODIUM CHLORIDE (PF) 0.9 % IJ SOLN
INTRAMUSCULAR | Status: DC | PRN
  Administered 2022-11-25: 120 mL via INTRAMUSCULAR

## 2022-11-25 MED ORDER — CELECOXIB 200 MG PO CAPS
ORAL_CAPSULE | ORAL | Status: AC
  Filled 2022-11-25: qty 1

## 2022-11-25 MED ORDER — ONDANSETRON HCL 4 MG/2ML IJ SOLN: 4.0000 mg | Freq: Four times a day (QID) | INTRAMUSCULAR | Status: DC | PRN

## 2022-11-25 MED ORDER — ACETAMINOPHEN 10 MG/ML IV SOLN
INTRAVENOUS | Status: AC
  Filled 2022-11-25: qty 100

## 2022-11-25 MED ORDER — FENTANYL CITRATE (PF) 100 MCG/2ML IJ SOLN
25.0000 ug | INTRAMUSCULAR | Status: DC | PRN
  Administered 2022-11-25 (×2): 25 ug via INTRAVENOUS

## 2022-11-25 MED ORDER — TRAMADOL HCL 50 MG PO TABS
50.0000 mg | ORAL_TABLET | ORAL | Status: DC | PRN
  Administered 2022-11-25: 50 mg via ORAL

## 2022-11-25 MED ORDER — OXYCODONE HCL 5 MG/5ML PO SOLN: 5.0000 mg | Freq: Once | ORAL | Status: DC | PRN

## 2022-11-25 MED ORDER — MENTHOL 3 MG MT LOZG: 1.0000 | LOZENGE | OROMUCOSAL | Status: DC | PRN

## 2022-11-25 MED ORDER — PHENYLEPHRINE HCL-NACL 20-0.9 MG/250ML-% IV SOLN
INTRAVENOUS | Status: AC
  Filled 2022-11-25: qty 250

## 2022-11-25 MED ORDER — MONTELUKAST SODIUM 10 MG PO TABS: 10.0000 mg | ORAL_TABLET | Freq: Every day | ORAL | Status: DC | PRN

## 2022-11-25 MED ORDER — PRAVASTATIN SODIUM 40 MG PO TABS
40.0000 mg | ORAL_TABLET | Freq: Every day | ORAL | Status: DC
  Administered 2022-11-25: 40 mg via ORAL

## 2022-11-25 MED ORDER — FERROUS SULFATE 325 (65 FE) MG PO TABS
ORAL_TABLET | ORAL | Status: AC
  Filled 2022-11-25: qty 1

## 2022-11-25 MED ORDER — SENNOSIDES-DOCUSATE SODIUM 8.6-50 MG PO TABS
ORAL_TABLET | ORAL | Status: AC
  Filled 2022-11-25: qty 1

## 2022-11-25 MED ORDER — ACETAMINOPHEN 325 MG PO TABS: 325.0000 mg | ORAL_TABLET | Freq: Four times a day (QID) | ORAL | Status: DC | PRN

## 2022-11-25 MED ORDER — BISACODYL 10 MG RE SUPP: 10.0000 mg | Freq: Every day | RECTAL | Status: DC | PRN

## 2022-11-25 MED ORDER — GABAPENTIN 300 MG PO CAPS
ORAL_CAPSULE | ORAL | Status: AC
  Filled 2022-11-25: qty 1

## 2022-11-25 MED ORDER — NAPHAZOLINE-GLYCERIN 0.012-0.25 % OP SOLN: 1.0000 [drp] | OPHTHALMIC | Status: DC | PRN

## 2022-11-25 MED ORDER — ASPIRIN 81 MG PO CHEW
CHEWABLE_TABLET | ORAL | Status: AC
  Filled 2022-11-25: qty 1

## 2022-11-25 MED ORDER — MAGNESIUM HYDROXIDE 400 MG/5ML PO SUSP
30.0000 mL | Freq: Every day | ORAL | Status: DC
  Administered 2022-11-26: 30 mL via ORAL

## 2022-11-25 MED ORDER — ENSURE PRE-SURGERY PO LIQD
296.0000 mL | Freq: Once | ORAL | Status: DC
  Filled 2022-11-25: qty 296

## 2022-11-25 MED ORDER — METOCLOPRAMIDE HCL 10 MG PO TABS
10.0000 mg | ORAL_TABLET | Freq: Three times a day (TID) | ORAL | Status: DC
  Administered 2022-11-25 – 2022-11-26 (×3): 10 mg via ORAL

## 2022-11-25 MED ORDER — ACETAMINOPHEN 10 MG/ML IV SOLN: 1000.0000 mg | Freq: Once | INTRAVENOUS | Status: DC | PRN

## 2022-11-25 MED ORDER — SURGIPHOR WOUND IRRIGATION SYSTEM - OPTIME: TOPICAL | Status: DC | PRN

## 2022-11-25 MED ORDER — LOSARTAN POTASSIUM-HCTZ 50-12.5 MG PO TABS: 1.0000 | ORAL_TABLET | Freq: Every day | ORAL | Status: DC

## 2022-11-25 MED ORDER — FENTANYL CITRATE (PF) 100 MCG/2ML IJ SOLN
INTRAMUSCULAR | Status: AC
  Filled 2022-11-25: qty 2

## 2022-11-25 MED ORDER — PROPOFOL 500 MG/50ML IV EMUL
INTRAVENOUS | Status: DC | PRN
  Administered 2022-11-25: 75 ug/kg/min via INTRAVENOUS

## 2022-11-25 MED ORDER — OXYCODONE HCL 5 MG PO TABS
5.0000 mg | ORAL_TABLET | ORAL | Status: DC | PRN
  Administered 2022-11-25 (×2): 5 mg via ORAL

## 2022-11-25 MED ORDER — FENTANYL CITRATE (PF) 100 MCG/2ML IJ SOLN
INTRAMUSCULAR | Status: DC | PRN
  Administered 2022-11-25: 50 ug via INTRAVENOUS

## 2022-11-25 MED ORDER — ONDANSETRON HCL 4 MG PO TABS: 4.0000 mg | ORAL_TABLET | Freq: Four times a day (QID) | ORAL | Status: DC | PRN

## 2022-11-25 MED ORDER — SODIUM CHLORIDE 0.9 % IV SOLN: INTRAVENOUS | Status: DC

## 2022-11-25 MED ORDER — BUPIVACAINE-EPINEPHRINE (PF) 0.25% -1:200000 IJ SOLN
INTRAMUSCULAR | Status: AC
  Filled 2022-11-25: qty 60

## 2022-11-25 MED ORDER — HYDROMORPHONE HCL 1 MG/ML IJ SOLN: 0.5000 mg | INTRAMUSCULAR | Status: DC | PRN

## 2022-11-25 MED ORDER — OXYCODONE HCL 5 MG PO TABS: 5.0000 mg | ORAL_TABLET | Freq: Once | ORAL | Status: DC | PRN

## 2022-11-25 MED ORDER — ACETAMINOPHEN 10 MG/ML IV SOLN
1000.0000 mg | Freq: Four times a day (QID) | INTRAVENOUS | Status: DC
  Administered 2022-11-25 – 2022-11-26 (×3): 1000 mg via INTRAVENOUS

## 2022-11-25 MED ORDER — DEXAMETHASONE SODIUM PHOSPHATE 10 MG/ML IJ SOLN
INTRAMUSCULAR | Status: AC
  Filled 2022-11-25: qty 1

## 2022-11-25 MED ORDER — PHENYLEPHRINE HCL (PRESSORS) 10 MG/ML IV SOLN
INTRAVENOUS | Status: DC | PRN
  Administered 2022-11-25: 80 ug via INTRAVENOUS

## 2022-11-25 MED ORDER — LOSARTAN POTASSIUM 50 MG PO TABS
50.0000 mg | ORAL_TABLET | Freq: Every day | ORAL | Status: DC
  Administered 2022-11-26: 50 mg via ORAL

## 2022-11-25 MED ORDER — PANTOPRAZOLE SODIUM 40 MG PO TBEC
40.0000 mg | DELAYED_RELEASE_TABLET | Freq: Two times a day (BID) | ORAL | Status: DC
  Administered 2022-11-25 – 2022-11-26 (×3): 40 mg via ORAL

## 2022-11-25 MED ORDER — FLEET ENEMA RE ENEM: 1.0000 | ENEMA | Freq: Once | RECTAL | Status: DC | PRN

## 2022-11-25 MED ORDER — SENNA 8.6 MG PO TABS
ORAL_TABLET | ORAL | Status: AC
  Filled 2022-11-25: qty 1

## 2022-11-25 MED ORDER — CETIRIZINE HCL 10 MG PO TABS: 10.0000 mg | ORAL_TABLET | Freq: Every evening | ORAL | Status: DC | PRN

## 2022-11-25 MED ORDER — CELECOXIB 200 MG PO CAPS
ORAL_CAPSULE | ORAL | Status: AC
  Filled 2022-11-25: qty 2

## 2022-11-25 MED ORDER — PHENYLEPHRINE HCL-NACL 20-0.9 MG/250ML-% IV SOLN
INTRAVENOUS | Status: DC | PRN
Start: 2022-11-25 — End: 2022-11-25
  Administered 2022-11-25: 30 ug/min via INTRAVENOUS

## 2022-11-25 SURGICAL SUPPLY — 76 items
ATTUNE PS FEM RT SZ 4 CEM KNEE (Femur) IMPLANT
ATTUNE PSRP INSR SZ4 5 KNEE (Insert) IMPLANT
BASEPLATE TIBIAL ROTATING SZ 4 (Knees) IMPLANT
BATTERY INSTRU NAVIGATION (MISCELLANEOUS) ×4 IMPLANT
BIT DRILL QUICK REL 1/8 2PK SL (BIT) ×1 IMPLANT
BLADE SAW 70X12.5 (BLADE) ×1 IMPLANT
BLADE SAW 90X13X1.19 OSCILLAT (BLADE) ×1 IMPLANT
BLADE SAW 90X25X1.19 OSCILLAT (BLADE) ×1 IMPLANT
BONE CEMENT GENTAMICIN (Cement) IMPLANT
BRUSH SCRUB EZ PLAIN DRY (MISCELLANEOUS) ×1 IMPLANT
BSPLAT TIB 4 CMNT ROT PLAT STR (Knees) ×1 IMPLANT
BTRY SRG DRVR LF (MISCELLANEOUS) ×4
CEMENT HV SMART SET (Cement) IMPLANT
COOLER POLAR GLACIER W/PUMP (MISCELLANEOUS) ×1 IMPLANT
CUFF TOURN SGL QUICK 24 (TOURNIQUET CUFF) ×1
CUFF TOURN SGL QUICK 30 (TOURNIQUET CUFF)
CUFF TRNQT CYL 24X4X16.5-23 (TOURNIQUET CUFF) IMPLANT
CUFF TRNQT CYL 30X4X21-28X (TOURNIQUET CUFF) IMPLANT
DRAPE INCISE IOBAN 66X45 STRL (DRAPES) IMPLANT
DRAPE SHEET LG 3/4 BI-LAMINATE (DRAPES) ×1 IMPLANT
DRSG AQUACEL AG ADV 3.5X14 (GAUZE/BANDAGES/DRESSINGS) ×1 IMPLANT
DRSG MEPILEX SACRM 8.7X9.8 (GAUZE/BANDAGES/DRESSINGS) ×1 IMPLANT
DRSG TEGADERM 4X4.75 (GAUZE/BANDAGES/DRESSINGS) ×1 IMPLANT
DURAPREP 26ML APPLICATOR (WOUND CARE) ×2 IMPLANT
ELECT CAUTERY BLADE 6.4 (BLADE) ×1 IMPLANT
ELECT REM PT RETURN 9FT ADLT (ELECTROSURGICAL) ×1
ELECTRODE REM PT RTRN 9FT ADLT (ELECTROSURGICAL) ×1 IMPLANT
EVACUATOR 1/8 PVC DRAIN (DRAIN) ×1 IMPLANT
EX-PIN ORTHOLOCK NAV 4X150 (PIN) ×2 IMPLANT
GAUZE XEROFORM 1X8 LF (GAUZE/BANDAGES/DRESSINGS) ×1 IMPLANT
GLOVE BIOGEL M STRL SZ7.5 (GLOVE) ×4 IMPLANT
GLOVE SRG 8 PF TXTR STRL LF DI (GLOVE) ×2 IMPLANT
GLOVE SURG UNDER POLY LF SZ8 (GLOVE) ×2
GOWN STRL REUS W/ TWL LRG LVL3 (GOWN DISPOSABLE) ×1 IMPLANT
GOWN STRL REUS W/ TWL XL LVL3 (GOWN DISPOSABLE) ×1 IMPLANT
GOWN STRL REUS W/TWL LRG LVL3 (GOWN DISPOSABLE) ×1
GOWN STRL REUS W/TWL XL LVL3 (GOWN DISPOSABLE) ×1
GOWN TOGA ZIPPER T7+ PEEL AWAY (MISCELLANEOUS) ×1 IMPLANT
HANDLE YANKAUER SUCT OPEN TIP (MISCELLANEOUS) ×1 IMPLANT
HOLDER FOLEY CATH W/STRAP (MISCELLANEOUS) ×1 IMPLANT
HOOD PEEL AWAY T7 (MISCELLANEOUS) ×1 IMPLANT
IV NS IRRIG 3000ML ARTHROMATIC (IV SOLUTION) ×1 IMPLANT
KIT TURNOVER KIT A (KITS) ×1 IMPLANT
KNIFE SCULPS 14X20 (INSTRUMENTS) ×1 IMPLANT
MANIFOLD NEPTUNE II (INSTRUMENTS) ×2 IMPLANT
NDL SPNL 20GX3.5 QUINCKE YW (NEEDLE) ×2 IMPLANT
NEEDLE SPNL 20GX3.5 QUINCKE YW (NEEDLE) ×2 IMPLANT
PACK TOTAL KNEE (MISCELLANEOUS) ×1 IMPLANT
PAD ABD DERMACEA PRESS 5X9 (GAUZE/BANDAGES/DRESSINGS) ×2 IMPLANT
PAD ARMBOARD 7.5X6 YLW CONV (MISCELLANEOUS) ×3 IMPLANT
PAD WRAPON POLAR KNEE (MISCELLANEOUS) ×1 IMPLANT
PATELLA MEDIAL ATTUN 35MM KNEE (Knees) IMPLANT
PENCIL SMOKE EVACUATOR COATED (MISCELLANEOUS) ×1 IMPLANT
PIN DRILL FIX HALF THREAD (BIT) ×2 IMPLANT
PIN FIXATION 1/8DIA X 3INL (PIN) ×1 IMPLANT
PULSAVAC PLUS IRRIG FAN TIP (DISPOSABLE) ×1
SOL PREP PVP 2OZ (MISCELLANEOUS) ×1
SOLUTION IRRIG SURGIPHOR (IV SOLUTION) ×1 IMPLANT
SOLUTION PREP PVP 2OZ (MISCELLANEOUS) ×1 IMPLANT
SPONGE DRAIN TRACH 4X4 STRL 2S (GAUZE/BANDAGES/DRESSINGS) ×1 IMPLANT
STAPLER SKIN PROX 35W (STAPLE) ×1 IMPLANT
STOCKINETTE IMPERV 14X48 (MISCELLANEOUS) ×1 IMPLANT
STRAP TIBIA SHORT (MISCELLANEOUS) ×1 IMPLANT
SUCTION TUBE FRAZIER 10FR DISP (SUCTIONS) ×1 IMPLANT
SUT VIC AB 0 CT1 36 (SUTURE) ×1 IMPLANT
SUT VIC AB 1 CT1 36 (SUTURE) ×2 IMPLANT
SUT VIC AB 2-0 CT2 27 (SUTURE) ×1 IMPLANT
SYR 30ML LL (SYRINGE) ×2 IMPLANT
TIP FAN IRRIG PULSAVAC PLUS (DISPOSABLE) ×1 IMPLANT
TOWEL OR 17X26 4PK STRL BLUE (TOWEL DISPOSABLE) IMPLANT
TOWER CARTRIDGE SMART MIX (DISPOSABLE) ×1 IMPLANT
TRAP FLUID SMOKE EVACUATOR (MISCELLANEOUS) ×1 IMPLANT
TRAY FOLEY MTR SLVR 16FR STAT (SET/KITS/TRAYS/PACK) ×1 IMPLANT
TUBING CONNECTING 10 (TUBING) ×2 IMPLANT
WATER STERILE IRR 1000ML POUR (IV SOLUTION) ×1 IMPLANT
WRAPON POLAR PAD KNEE (MISCELLANEOUS) ×1

## 2022-11-25 NOTE — Anesthesia Preprocedure Evaluation (Addendum)
Anesthesia Evaluation  Patient identified by MRN, date of birth, ID band Patient awake    Reviewed: Allergy & Precautions, NPO status , Patient's Chart, lab work & pertinent test results  History of Anesthesia Complications Negative for: history of anesthetic complications  Airway Mallampati: II   Neck ROM: Full    Dental  (+) Missing, Chipped   Pulmonary  Hx COVID and bronchitis earlier this year   Pulmonary exam normal breath sounds clear to auscultation       Cardiovascular hypertension, Normal cardiovascular exam Rhythm:Regular Rate:Normal  ECG 11/18/22:  Normal sinus rhythm Left bundle branch block Abnormal ECG When compared with ECG of 07-Mar-2021 11:46, No significant change was found   Neuro/Psych  PSYCHIATRIC DISORDERS Anxiety     negative neurological ROS     GI/Hepatic ,GERD  ,,  Endo/Other  Obesity   Renal/GU negative Renal ROS     Musculoskeletal  (+) Arthritis ,    Abdominal   Peds  Hematology  (+) Blood dyscrasia, anemia   Anesthesia Other Findings   Reproductive/Obstetrics                             Anesthesia Physical Anesthesia Plan  ASA: 2  Anesthesia Plan: General and Spinal   Post-op Pain Management:    Induction: Intravenous  PONV Risk Score and Plan: 3 and Propofol infusion, TIVA, Treatment may vary due to age or medical condition and Ondansetron  Airway Management Planned: Natural Airway and Nasal Cannula  Additional Equipment:   Intra-op Plan:   Post-operative Plan:   Informed Consent: I have reviewed the patients History and Physical, chart, labs and discussed the procedure including the risks, benefits and alternatives for the proposed anesthesia with the patient or authorized representative who has indicated his/her understanding and acceptance.       Plan Discussed with: CRNA  Anesthesia Plan Comments: (Plan for spinal and GA with  natural airway, LMA/GETA backup.  Patient consented for risks of anesthesia including but not limited to:  - adverse reactions to medications - damage to eyes, teeth, lips or other oral mucosa - nerve damage due to positioning  - sore throat or hoarseness - headache, bleeding, infection, nerve damage 2/2 spinal - damage to heart, brain, nerves, lungs, other parts of body or loss of life  Informed patient about role of CRNA in peri- and intra-operative care.  Patient voiced understanding.)        Anesthesia Quick Evaluation

## 2022-11-25 NOTE — TOC Progression Note (Signed)
Transition of Care [facility]) - Progression Note    Patient Details  Name: Roberta Hanson MRN: [id number] Date of Birth: [birth year]  Transition of Care [facility]) CM/SW Contact  Marlowe Sax, RN Phone Number: 11/25/2022, 11:56 AM  Clinical Narrative:     The patient is set up with [facility] services [employer] to deliver a rolling walker to the bedside  Expected Discharge Plan: Home w Home Health Services Barriers to Discharge: No Barriers Identified  Expected Discharge Plan and Services   Discharge Planning Services: CM Consult   Living arrangements for the past 2 months: Single Family Home                 DME Arranged: Walker rolling DME Agency: [employer] Date DME Agency Contacted: 11/25/22 Time DME Agency Contacted: 1156 Representative spoke with at DME Agency: Clet[facility] [place] HH Arranged: PT, OT HH Agency: [facility] Date [facility] Agency Contacted: 11/25/22 Time HH Agency Contacted: 1156 Representative spoke with at [facility] Agency: [place]   Social Determinants of Health (SDOH) Interventions SDOH Screenings   Food Insecurity: No Food Insecurity (11/25/2022)  Housing: Low Risk  (11/25/2022)  Transportation Needs: No Transportation Needs (11/25/2022)  Utilities: Not At Risk (11/25/2022)  Tobacco Use: Low Risk  (11/25/2022)    Readmission Risk Interventions     No data to display

## 2022-11-25 NOTE — Care Management Obs Status (Signed)
MEDICARE OBSERVATION STATUS NOTIFICATION   Patient Details  Name: Roberta Hanson MRN: [id number] Date of Birth: March 27, 1940   Medicare Observation Status Notification Given:  Yes    Marlowe Sax, RN 11/25/2022, 11:52 AM

## 2022-11-25 NOTE — Anesthesia Postprocedure Evaluation (Signed)
Anesthesia Post Note  Patient: Roberta Hanson  Procedure(s) Performed: COMPUTER ASSISTED TOTAL KNEE ARTHROPLASTY (Right: Knee)  Patient location during evaluation: PACU Anesthesia Type: Spinal Level of consciousness: awake and alert, oriented and patient cooperative Pain management: pain level controlled Vital Signs Assessment: post-procedure vital signs reviewed and stable Respiratory status: spontaneous breathing, nonlabored ventilation and respiratory function stable Cardiovascular status: blood pressure returned to baseline and stable Postop Assessment: adequate PO intake, no headache, no backache and spinal receding Anesthetic complications: no   No notable events documented.   Last Vitals:  Vitals:   11/25/22 1115 11/25/22 1130  BP: 124/68 124/62  Pulse: 80 81  Resp: 20 16  Temp:    SpO2: 100% 98%    Last Pain:  Vitals:   11/25/22 1130  TempSrc:   PainSc: 0-No pain                 Reed Breech

## 2022-11-25 NOTE — Progress Notes (Signed)
Patient is not able to walk the distance required to go the bathroom, or he/she is unable to safely negotiate stairs required to access the bathroom.  A 3in1 BSC will alleviate this problem   Monroe Toure P. Angie Fava M.D.

## 2022-11-25 NOTE — Evaluation (Signed)
Physical Therapy Evaluation Patient Details Name: Roberta Hanson MRN: [id number] DOB: 01/04/1941 Today's Date: 11/25/2022  History of Present Illness  Pt is a 82 y.o. female s/p R TKR (POD 0)  Clinical Impression  Pt is received in bed with daughter, Roberta Hanson, at bedside, she is agreeable to PT session. AROM R knee flex 60 deg, R knee ext +5 deg. Pt performs bed mobility mod I, transfers CGA-min A, and amb CGA for safety. Pt endorses 1/10 NPS R knee pain describing pain as soreness. Pt able to amb approx 100 ft using RW demonstrating step-through step pattern with no reports of increased discomfort. Plans to practice stair negotiation next visit. Pt would benefit from skilled PT to address above deficits and promote optimal return to PLOF.        If plan is discharge home, recommend the following: A little help with walking and/or transfers;A little help with bathing/dressing/bathroom;Assist for transportation;Help with stairs or ramp for entrance   Can travel by private vehicle        Equipment Recommendations None recommended by PT  Recommendations for Other Services       Functional Status Assessment Patient has had a recent decline in their functional status and demonstrates the ability to make significant improvements in function in a reasonable and predictable amount of time.     Precautions / Restrictions Precautions Precautions: Knee;Fall Precaution Booklet Issued: No Restrictions Weight Bearing Restrictions: Yes RLE Weight Bearing: Weight bearing as tolerated      Mobility  Bed Mobility Overal bed mobility: Modified Independent             General bed mobility comments: increased time and HOB elevated; no cuing required    Transfers Overall transfer level: Needs assistance Equipment used: Rolling walker (2 wheels) Transfers: Sit to/from Stand Sit to Stand: Contact guard assist, Min assist           General transfer comment: able to perform STS from bed  CGA with use of RW; required min A with STS from toilet    Ambulation/Gait Ambulation/Gait assistance: Contact guard assist Gait Distance (Feet): 100 Feet Assistive device: Rolling walker (2 wheels) Gait Pattern/deviations: Step-through pattern, Decreased stride length, Decreased stance time - right Gait velocity: slightly decreased     General Gait Details: slight decreased stance phase on RLE  Stairs            Wheelchair Mobility     Tilt Bed    Modified Rankin (Stroke Patients Only)       Balance Overall balance assessment: Needs assistance Sitting-balance support: No upper extremity supported, Feet supported Sitting balance-Leahy Scale: Good Sitting balance - Comments: able to maintain seated EOB balance during functional activities without difficulty   Standing balance support: Bilateral upper extremity supported, During functional activity, Reliant on assistive device for balance Standing balance-Leahy Scale: Good Standing balance comment: able to maintain static standing balance during functional activities                             Pertinent Vitals/Pain Pain Assessment Pain Assessment: 0-10 Pain Score: 1  Pain Location: R knee Pain Descriptors / Indicators: Sore, Operative site guarding Pain Intervention(s): Monitored during session, Premedicated before session    Home Living Family/patient expects to be discharged to:: Private residence Living Arrangements: Children Available Help at Discharge: Family;Available PRN/intermittently Type of Home: House Home Access: Stairs to enter Entrance Stairs-Rails: Right;Left;Can reach both Entrance Stairs-Number of Steps:  2   Home Layout: One level Home Equipment: Agricultural consultant (2 wheels)      Prior Function Prior Level of Function : Independent/Modified Independent;Driving                     Extremity/Trunk Assessment   Upper Extremity Assessment Upper Extremity Assessment:  Overall WFL for tasks assessed    Lower Extremity Assessment Lower Extremity Assessment: Generalized weakness       Communication   Communication Communication: No apparent difficulties Cueing Techniques: Verbal cues  Cognition Arousal: Alert Behavior During Therapy: WFL for tasks assessed/performed Overall Cognitive Status: Within Functional Limits for tasks assessed                                 General Comments: AO x4; Pleasant and eager to work with PT        General Comments General comments (skin integrity, edema, etc.): R wound vac intact pre/post session    Exercises Total Joint Exercises Goniometric ROM: R knee flex- 60, R knee ext- +5 Other Exercises Other Exercises: toileting; close sup for safety   Assessment/Plan    PT Assessment Patient needs continued PT services  PT Problem List Decreased strength;Decreased mobility;Decreased range of motion;Decreased activity tolerance;Decreased balance;Pain       PT Treatment Interventions DME instruction;Gait training;Stair training;Functional mobility training;Therapeutic activities;Therapeutic exercise    PT Goals (Current goals can be found in the Care Plan section)  Acute Rehab PT Goals Patient Stated Goal: to go home PT Goal Formulation: With patient Time For Goal Achievement: 12/09/22 Potential to Achieve Goals: Good    Frequency Min 1X/week     Co-evaluation               AM-PAC PT "6 Clicks" Mobility  Outcome Measure Help needed turning from your back to your side while in a flat bed without using bedrails?: None Help needed moving from lying on your back to sitting on the side of a flat bed without using bedrails?: None Help needed moving to and from a bed to a chair (including a wheelchair)?: A Little Help needed standing up from a chair using your arms (e.g., wheelchair or bedside chair)?: A Little Help needed to walk in hospital room?: A Little Help needed climbing 3-5  steps with a railing? : A Lot 6 Click Score: 19    End of Session Equipment Utilized During Treatment: Gait belt Activity Tolerance: Patient tolerated treatment well;No increased pain Patient left: in chair;with call bell/phone within reach;with family/visitor present;Other (comment) (polar care reapplied) Nurse Communication: Mobility status PT Visit Diagnosis: Unsteadiness on feet (R26.81);Muscle weakness (generalized) (M62.81);Pain Pain - Right/Left: Right Pain - part of body: Knee    Time: 0865-7846 PT Time Calculation (min) (ACUTE ONLY): 19 min   Charges:   PT Evaluation $PT Eval Moderate Complexity: 1 Mod PT Treatments $Gait Training: 8-22 mins PT General Charges $$ ACUTE PT VISIT: 1 Visit         Elmon Else, SPT   Ganesh Deeg 11/25/2022, 4:07 PM

## 2022-11-25 NOTE — Transfer of Care (Signed)
Immediate Anesthesia Transfer of Care Note  Patient: Roberta Hanson  Procedure(s) Performed: COMPUTER ASSISTED TOTAL KNEE ARTHROPLASTY (Right: Knee)  Patient Location: PACU  Anesthesia Type:General  Level of Consciousness: awake and alert   Airway & Oxygen Therapy: Patient Spontanous Breathing and Patient connected to face mask oxygen  Post-op Assessment: Report given to RN and Post -op Vital signs reviewed and stable  Post vital signs: Reviewed and stable  Last Vitals:  Vitals Value Taken Time  BP 121/50 11/25/22 1105  Temp 36.1 C 11/25/22 1105  Pulse 81 11/25/22 1105  Resp 18 11/25/22 1105  SpO2 100 % 11/25/22 1105  Vitals shown include unfiled device data.  Last Pain:  Vitals:   11/25/22 0628  TempSrc: Temporal  PainSc: 4          Complications: No notable events documented.

## 2022-11-25 NOTE — Interval H&P Note (Signed)
History and Physical Interval Note:  11/25/2022 6:07 AM  Roberta Hanson  has presented today for surgery, with the diagnosis of PRIMARY OSTEOARTHRITIS OF RIGHT KNEE..  The various methods of treatment have been discussed with the patient and family. After consideration of risks, benefits and other options for treatment, the patient has consented to  Procedure(s): COMPUTER ASSISTED TOTAL KNEE ARTHROPLASTY (Right) as a surgical intervention.  The patient's history has been reviewed, patient examined, no change in status, stable for surgery.  I have reviewed the patient's chart and labs.  Questions were answered to the patient's satisfaction.     Caley Volkert P Stashia Sia

## 2022-11-25 NOTE — Op Note (Signed)
OPERATIVE NOTE  DATE OF SURGERY:  11/25/2022  PATIENT NAME:  GURTHA LINDWALL   DOB: September 04, 1940  MRN: [id number]  PRE-OPERATIVE DIAGNOSIS: Degenerative arthrosis of the right knee, primary  POST-OPERATIVE DIAGNOSIS:  Same  PROCEDURE:  Right total knee arthroplasty using computer-assisted navigation  SURGEON:  Jena Gauss. M.D.  ASSISTANT:  Gean Birchwood, PA-C (present and scrubbed throughout the case, critical for assistance with exposure, retraction, instrumentation, and closure)  ANESTHESIA: spinal  ESTIMATED BLOOD LOSS: 50 mL  FLUIDS REPLACED: 1000 mL of crystalloid  TOURNIQUET TIME: 88 minutes  DRAINS: 2 medium Hemovac drains  SOFT TISSUE RELEASES: Anterior cruciate ligament, posterior cruciate ligament, deep medial collateral ligament, patellofemoral ligament  IMPLANTS UTILIZED: DePuy Attune size 4 posterior stabilized femoral component (cemented), size 4 rotating platform tibial component (cemented), 35 mm medialized dome patella (cemented), and a 5 mm stabilized rotating platform polyethylene insert.  INDICATIONS FOR SURGERY: SENNIE KINN is an 82 y.o. year old female with a long history of progressive knee pain. X-rays demonstrated severe degenerative changes in tricompartmental fashion. The patient had not seen any significant improvement despite conservative nonsurgical intervention. After discussion of the risks and benefits of surgical intervention, the patient expressed understanding of the risks benefits and agree with plans for total knee arthroplasty.   The risks, benefits, and alternatives were discussed at length including but not limited to the risks of infection, bleeding, nerve injury, stiffness, blood clots, the need for revision surgery, cardiopulmonary complications, among others, and they were willing to proceed.  PROCEDURE IN DETAIL: The patient was brought into the operating room and, after adequate spinal anesthesia was achieved, a tourniquet was  placed on the patient's upper thigh. The patient's knee and leg were cleaned and prepped with alcohol and DuraPrep and draped in the usual sterile fashion. A "timeout" was performed as per usual protocol. The lower extremity was exsanguinated using an Esmarch, and the tourniquet was inflated to 300 mmHg. An anterior longitudinal incision was made followed by a standard mid vastus approach. The deep fibers of the medial collateral ligament were elevated in a subperiosteal fashion off of the medial flare of the tibia so as to maintain a continuous soft tissue sleeve. The patella was subluxed laterally and the patellofemoral ligament was incised. Inspection of the knee demonstrated severe degenerative changes with full-thickness loss of articular cartilage. Osteophytes were debrided using a rongeur. Anterior and posterior cruciate ligaments were excised. Two 4.0 mm Schanz pins were inserted in the femur and into the tibia for attachment of the array of trackers used for computer-assisted navigation. Hip center was identified using a circumduction technique. Distal landmarks were mapped using the computer. The distal femur and proximal tibia were mapped using the computer. The distal femoral cutting guide was positioned using computer-assisted navigation so as to achieve a 5 distal valgus cut. The femur was sized and it was felt that a size 4 femoral component was appropriate. A size 4 femoral cutting guide was positioned and the anterior cut was performed and verified using the computer. This was followed by completion of the posterior and chamfer cuts. Femoral cutting guide for the central box was then positioned in the center box cut was performed.  Attention was then directed to the proximal tibia. Medial and lateral menisci were excised. The extramedullary tibial cutting guide was positioned using computer-assisted navigation so as to achieve a 0 varus-valgus alignment and 3 posterior slope. The cut was  performed and verified using the computer. The proximal tibia  was sized and it was felt that a size 4 tibial tray was appropriate. Tibial and femoral trials were inserted followed by insertion of a 5 mm polyethylene insert.  The knee was felt to be tight both in flexion and extension.  The implants were removed and the extramedullary tibial cutting guide was reapplied so as to resect an additional 2 mm of bone.  Cut was performed and verified using computer.  Trial components were replaced.  This allowed for excellent mediolateral soft tissue balancing both in flexion and in full extension. Finally, the patella was cut and prepared so as to accommodate a 35 mm medialized dome patella. A patella trial was placed and the knee was placed through a range of motion with excellent patellar tracking appreciated. The femoral trial was removed after debridement of posterior osteophytes. The central post-hole for the tibial component was reamed followed by insertion of a keel punch. Tibial trials were then removed. Cut surfaces of bone were irrigated with copious amounts of normal saline using pulsatile lavage and then suctioned dry. Polymethylmethacrylate cement was prepared in the usual fashion using a vacuum mixer. Cement was applied to the cut surface of the proximal tibia as well as along the undersurface of a size 4 rotating platform tibial component. Tibial component was positioned and impacted into place. Excess cement was removed using Personal assistant. Cement was then applied to the cut surfaces of the femur as well as along the posterior flanges of the size 4 femoral component. The femoral component was positioned and impacted into place. Excess cement was removed using Personal assistant. A 5 mm polyethylene trial was inserted and the knee was brought into full extension with steady axial compression applied. Finally, cement was applied to the backside of a 35 mm medialized dome patella and the patellar component was  positioned and patellar clamp applied. Excess cement was removed using Personal assistant. After adequate curing of the cement, the tourniquet was deflated after a total tourniquet time of 88 minutes. Hemostasis was achieved using electrocautery. The knee was irrigated with copious amounts of normal saline using pulsatile lavage followed by 450 ml of Surgiphor and then suctioned dry. 20 mL of 1.3% Exparel and 60 mL of 0.25% Marcaine in 40 mL of normal saline was injected along the posterior capsule, medial and lateral gutters, and along the arthrotomy site. A 5 mm stabilized rotating platform polyethylene insert was inserted and the knee was placed through a range of motion with excellent mediolateral soft tissue balancing appreciated and excellent patellar tracking noted. 2 medium drains were placed in the wound bed and brought out through separate stab incisions. The medial parapatellar portion of the incision was reapproximated using interrupted sutures of #1 Vicryl. Subcutaneous tissue was approximated in layers using first #0 Vicryl followed #2-0 Vicryl. The skin was approximated with skin staples. A sterile dressing was applied.  The patient tolerated the procedure well and was transported to the recovery room in stable condition.    Dixie Coppa P. Angie Fava., M.D.

## 2022-11-25 NOTE — Anesthesia Procedure Notes (Signed)
Spinal  Patient location during procedure: OR Start time: 11/25/2022 7:15 AM End time: 11/25/2022 7:21 AM Reason for block: surgical anesthesia Staffing Anesthesiologist: Reed Breech, MD Resident/CRNA: Berniece Pap, CRNA Performed by: Berniece Pap, CRNA Authorized by: Reed Breech, MD   Preanesthetic Checklist Completed: patient identified, IV checked, site marked, risks and benefits discussed, surgical consent, monitors and equipment checked, pre-op evaluation and timeout performed Spinal Block Patient position: sitting Prep: DuraPrep Patient monitoring: heart rate, cardiac monitor, continuous pulse ox and blood pressure Approach: midline Location: L3-4 Injection technique: single-shot Needle Needle type: Sprotte  Needle gauge: 24 G Needle length: 9 cm Assessment Sensory level: T4 Events: CSF return

## 2022-11-26 DIAGNOSIS — M1711 Unilateral primary osteoarthritis, right knee: Secondary | ICD-10-CM | POA: Diagnosis not present

## 2022-11-26 MED ORDER — MAGNESIUM HYDROXIDE 400 MG/5ML PO SUSP
ORAL | Status: AC
  Filled 2022-11-26: qty 30

## 2022-11-26 MED ORDER — OXYCODONE HCL 5 MG PO TABS: 5.0000 mg | ORAL_TABLET | ORAL | 0 refills | Status: AC | PRN

## 2022-11-26 MED ORDER — CELECOXIB 200 MG PO CAPS
ORAL_CAPSULE | ORAL | Status: AC
  Filled 2022-11-26: qty 1

## 2022-11-26 MED ORDER — ASPIRIN 81 MG PO CHEW
CHEWABLE_TABLET | ORAL | Status: AC
  Filled 2022-11-26: qty 1

## 2022-11-26 MED ORDER — METOCLOPRAMIDE HCL 10 MG PO TABS
ORAL_TABLET | ORAL | Status: AC
  Filled 2022-11-26: qty 1

## 2022-11-26 MED ORDER — ACETAMINOPHEN 10 MG/ML IV SOLN
INTRAVENOUS | Status: AC
  Filled 2022-11-26: qty 100

## 2022-11-26 MED ORDER — TRAMADOL HCL 50 MG PO TABS: 50.0000 mg | ORAL_TABLET | ORAL | 0 refills | Status: AC | PRN

## 2022-11-26 MED ORDER — SENNOSIDES-DOCUSATE SODIUM 8.6-50 MG PO TABS
ORAL_TABLET | ORAL | Status: AC
  Filled 2022-11-26: qty 1

## 2022-11-26 MED ORDER — LOSARTAN POTASSIUM 50 MG PO TABS
ORAL_TABLET | ORAL | Status: AC
  Filled 2022-11-26: qty 1

## 2022-11-26 MED ORDER — HYDROCHLOROTHIAZIDE 25 MG PO TABS
ORAL_TABLET | ORAL | Status: AC
  Filled 2022-11-26: qty 1

## 2022-11-26 MED ORDER — CELECOXIB 200 MG PO CAPS: 200.0000 mg | ORAL_CAPSULE | Freq: Two times a day (BID) | ORAL | 1 refills | Status: AC

## 2022-11-26 MED ORDER — FERROUS SULFATE 325 (65 FE) MG PO TABS
ORAL_TABLET | ORAL | Status: AC
  Filled 2022-11-26: qty 1

## 2022-11-26 MED ORDER — ASPIRIN 81 MG PO TBEC: 81.0000 mg | DELAYED_RELEASE_TABLET | Freq: Two times a day (BID) | ORAL | Status: AC

## 2022-11-26 MED ORDER — POTASSIUM CHLORIDE CRYS ER 20 MEQ PO TBCR
EXTENDED_RELEASE_TABLET | ORAL | Status: AC
  Filled 2022-11-26: qty 1

## 2022-11-26 MED ORDER — PANTOPRAZOLE SODIUM 40 MG PO TBEC
DELAYED_RELEASE_TABLET | ORAL | Status: AC
  Filled 2022-11-26: qty 1

## 2022-11-26 NOTE — Plan of Care (Signed)
  Problem: Activity: Goal: Ability to avoid complications of mobility impairment will improve Outcome: Progressing   Problem: Pain Management: Goal: Pain level will decrease with appropriate interventions Outcome: Progressing   

## 2022-11-26 NOTE — Progress Notes (Signed)
Physical Therapy Treatment Patient Details Name: Roberta Hanson MRN: [id number] DOB: 10/11/1940 Today's Date: 11/26/2022   History of Present Illness Pt is a 82 y.o. female s/p R TKR (POD 0)    PT Comments  Patient received with OT ending session. Patient is supervision for ambulation of 200 feet with RW. Reciprocal gait pattern. She ambulated up/down 4 steps x 2 with B rails and supervision with cues for sequencing. Patient given exercise handout and exercises reviewed with patient. She demonstrates and verbalized understanding. Patient has met short term goals and is ready to discharge home.      If plan is discharge home, recommend the following: A little help with walking and/or transfers;A little help with bathing/dressing/bathroom;Assist for transportation;Help with stairs or ramp for entrance   Can travel by private vehicle      yes  Equipment Recommendations  None recommended by PT    Recommendations for Other Services       Precautions / Restrictions Precautions Precautions: Fall Restrictions Weight Bearing Restrictions: Yes RLE Weight Bearing: Weight bearing as tolerated     Mobility  Bed Mobility               General bed mobility comments: NT patient sitting edge of bed with OT present    Transfers Overall transfer level: Modified independent Equipment used: Rolling walker (2 wheels) Transfers: Sit to/from Stand Sit to Stand: Modified independent (Device/Increase time)                Ambulation/Gait Ambulation/Gait assistance: Supervision Gait Distance (Feet): 200 Feet Assistive device: Rolling walker (2 wheels) Gait Pattern/deviations: Step-through pattern Gait velocity: slightly decreased     General Gait Details: slight decreased stance phase on RLE   Stairs Stairs: Yes Stairs assistance: Supervision Stair Management: Two rails, Step to pattern, Forwards Number of Stairs: 8 General stair comments: supervision and cues for  sequencing   Wheelchair Mobility     Tilt Bed    Modified Rankin (Stroke Patients Only)       Balance Overall balance assessment: Modified Independent Sitting-balance support: Feet supported Sitting balance-Leahy Scale: Normal     Standing balance support: Bilateral upper extremity supported, During functional activity, Reliant on assistive device for balance Standing balance-Leahy Scale: Good                              Cognition Arousal: Alert Behavior During Therapy: WFL for tasks assessed/performed Overall Cognitive Status: Within Functional Limits for tasks assessed                                 General Comments: AO x4; Pleasant and eager to work with PT        Exercises Total Joint Exercises Ankle Circles/Pumps: AROM, Both, 5 reps Quad Sets: AROM, Right, 5 reps Short Arc Quad: AROM, Right, 5 reps Heel Slides: AROM, Right, 5 reps Hip ABduction/ADduction: AROM, Right, 5 reps Straight Leg Raises: AROM, Right, 5 reps Long Arc Quad: AROM, Right, 5 reps Knee Flexion: AROM, Right, 5 reps Goniometric ROM: 2-110    General Comments        Pertinent Vitals/Pain Pain Assessment Pain Score: 1  Pain Location: R knee Pain Descriptors / Indicators: Sore, Discomfort Pain Intervention(s): Monitored during session, Repositioned    Home Living Family/patient expects to be discharged to:: Private residence Living Arrangements: Children Available Help at Discharge: Family;Available  PRN/intermittently Type of Home: House Home Access: Stairs to enter Entrance Stairs-Rails: Right;Left;Can reach both Entrance Stairs-Number of Steps: 2   Home Layout: One level Home Equipment: Agricultural consultant (2 wheels);BSC/3in1;Grab bars - tub/shower;Shower seat Additional Comments: does not use shower seat    Prior Function            PT Goals (current goals can now be found in the care plan section) Acute Rehab PT Goals Patient Stated Goal: to go  home PT Goal Formulation: With patient Time For Goal Achievement: 12/09/22 Potential to Achieve Goals: Good Progress towards PT goals: Progressing toward goals    Frequency    Min 1X/week      PT Plan      Co-evaluation              AM-PAC PT "6 Clicks" Mobility   Outcome Measure  Help needed turning from your back to your side while in a flat bed without using bedrails?: None Help needed moving from lying on your back to sitting on the side of a flat bed without using bedrails?: None Help needed moving to and from a bed to a chair (including a wheelchair)?: None Help needed standing up from a chair using your arms (e.g., wheelchair or bedside chair)?: None Help needed to walk in hospital room?: A Little Help needed climbing 3-5 steps with a railing? : A Little 6 Click Score: 22    End of Session   Activity Tolerance: Patient tolerated treatment well Patient left: in chair;with call bell/phone within reach Nurse Communication: Mobility status PT Visit Diagnosis: Other abnormalities of gait and mobility (R26.89) Pain - Right/Left: Right Pain - part of body: Knee     Time: 0900-0919 PT Time Calculation (min) (ACUTE ONLY): 19 min  Charges:    $Therapeutic Exercise: 8-22 mins PT General Charges $$ ACUTE PT VISIT: 1 Visit                     Serai Tukes, PT, GCS 11/26/22,9:28 AM

## 2022-11-26 NOTE — Discharge Summary (Signed)
Physician Discharge Summary  Subjective: 1 Day Post-Op Procedure(s) (LRB): COMPUTER ASSISTED TOTAL KNEE ARTHROPLASTY (Right) Patient reports pain as mild.   Patient seen in rounds with Dr. Ernest Pine. Patient is well, and has had no acute complaints or problems.  Denies any CP, SOB, N/V, fevers or chills We will continue with therapy today.  Patient is ready to go home  Physician Discharge Summary  Patient ID: Roberta Hanson MRN: [id number] DOB/AGE: June 21, 1940 82 y.o.  Admit date: 11/25/2022 Discharge date: 11/26/2022  Admission Diagnoses:  Discharge Diagnoses:  Principal Problem:   Total knee replacement status   Discharged Condition: good  Hospital Course: Patient presented to the hospital on 11/25/2022 for an elective right total knee arthroplasty performed by Dr. Ernest Pine.  Patient was given 1 g of TXA and 2 g of Ancef perioperatively.  The patient tolerated the procedure well without any complications.  See operative details below.  Postoperatively, the patient did well, she was able to pass her PT protocols postop day 1.  She was able to urinate.  Her drain was pulled without any difficulty, intact.  She denies any shortness of breath, chest pain, N/B, fevers or chills.  She states that she is ready to go home.  Vital signs are stable.  Patient stable for discharge home.  PROCEDURE:  Right total knee arthroplasty using computer-assisted navigation   SURGEON:  Jena Gauss. M.D.   ASSISTANT:  Gean Birchwood, PA-C (present and scrubbed throughout the case, critical for assistance with exposure, retraction, instrumentation, and closure)   ANESTHESIA: spinal   ESTIMATED BLOOD LOSS: 50 mL   FLUIDS REPLACED: 1000 mL of crystalloid   TOURNIQUET TIME: 88 minutes   DRAINS: 2 medium Hemovac drains   SOFT TISSUE RELEASES: Anterior cruciate ligament, posterior cruciate ligament, deep medial collateral ligament, patellofemoral ligament   IMPLANTS UTILIZED: DePuy Attune size 4  posterior stabilized femoral component (cemented), size 4 rotating platform tibial component (cemented), 35 mm medialized dome patella (cemented), and a 5 mm stabilized rotating platform polyethylene insert.  Treatments: None  Discharge Exam: Blood pressure 137/86, pulse 71, temperature 97.6 F (36.4 C), temperature source Oral, resp. rate 18, height 5\' 2"  (1.575 m), weight 78.5 kg, SpO2 100%.   Disposition:    Allergies as of 11/26/2022       Reactions   Sulfa Antibiotics Rash   As a child   Lisinopril Cough   Citalopram Other (See Comments)   Feels aggitated   Metronidazole Diarrhea        Medication List     STOP taking these medications    meloxicam 15 MG tablet Commonly known as: MOBIC       TAKE these medications    acetaminophen 650 MG CR tablet Commonly known as: TYLENOL Take 650 mg by mouth every 8 (eight) hours as needed for pain.   ALIGN PO Take 1 capsule by mouth daily.   aspirin EC 81 MG tablet Take 1 tablet (81 mg total) by mouth in the morning and at bedtime. Swallow whole.   BEET ROOT PO Take 1 tablet by mouth 2 (two) times daily.   celecoxib 200 MG capsule Commonly known as: CELEBREX Take 1 capsule (200 mg total) by mouth 2 (two) times daily.   CLEAR EYES MAX REDNESS RELIEF OP Apply 1 drop to eye as needed.   EQ COMPLETE MULTIVITAMIN-ADULT PO Take 1 tablet by mouth 2 (two) times daily.   Emergen-C Vitamin C Pack Take 1 Package by mouth daily as needed.  fexofenadine 180 MG tablet Commonly known as: ALLEGRA Take 180 mg by mouth daily as needed.   Flaxseed Oil 1200 MG Caps Take 1 capsule by mouth daily.   fluticasone 50 MCG/ACT nasal spray Commonly known as: FLONASE Place 1 spray into both nostrils as needed for allergies or rhinitis.   GARLIQUE PO Take 1 capsule by mouth daily.   levocetirizine 5 MG tablet Commonly known as: XYZAL Take 5 mg by mouth at bedtime as needed for allergies.   Librax 5-2.5 MG capsule Generic  drug: clidinium-chlordiazePOXIDE Take 1 capsule by mouth 3 (three) times daily as needed.   losartan-hydrochlorothiazide 50-12.5 MG tablet Commonly known as: HYZAAR Take 1 tablet by mouth daily.   MAGNESIUM GLYCINATE PO Take 2 capsules by mouth daily as needed.   montelukast 10 MG tablet Commonly known as: SINGULAIR Take 10 mg by mouth daily as needed.   oxyCODONE 5 MG immediate release tablet Commonly known as: Oxy IR/ROXICODONE Take 1 tablet (5 mg total) by mouth every 4 (four) hours as needed for moderate pain (pain score 4-6).   pantoprazole 40 MG tablet Commonly known as: PROTONIX Take 40 mg by mouth daily.   Peppermint Oil 90 MG Cpcr Take 2 capsules by mouth daily as needed.   potassium chloride 10 MEQ tablet Commonly known as: KLOR-CON Take 10 mEq by mouth 2 (two) times daily.   pravastatin 40 MG tablet Commonly known as: PRAVACHOL Take 40 mg by mouth at bedtime.   traMADol 50 MG tablet Commonly known as: ULTRAM Take 1-2 tablets (50-100 mg total) by mouth every 4 (four) hours as needed for moderate pain.               Durable Medical Equipment  (From admission, onward)           Start     Ordered   11/25/22 1241  DME Walker rolling  Once       Question:  Patient needs a walker to treat with the following condition  Answer:  Total knee replacement status   11/25/22 1240   11/25/22 1241  DME Bedside commode  Once       Comments: Patient is not able to walk the distance required to go the bathroom, or he/she is unable to safely negotiate stairs required to access the bathroom.  A 3in1 BSC will alleviate this problem  Question:  Patient needs a bedside commode to treat with the following condition  Answer:  Total knee replacement status   11/25/22 1240   11/25/22 1143  For home use only DME Walker rolling  Once       Question Answer Comment  Walker: With 5 Inch Wheels   Patient needs a walker to treat with the following condition Impaired mobility       11/25/22 1143            Follow-up Information     Rayburn Go, PA-C Follow up on 12/10/2022.   Specialty: Orthopedic Surgery Why: at 1:45pm Contact information: [address] [place] [address] [phone]         Donato Heinz, MD Follow up on 01/07/2023.   Specialty: Orthopedic Surgery Why: at 2:45pm Contact information: [address] [facility] [address] [facility] [place] [address] [phone]                 Signed: Gean Birchwood 11/26/2022, 8:30 AM   Objective: Vital signs in last 24 hours: Temp:  [97 F (36.1 C)-97.9 F (36.6  C)] 97.6 F (36.4 C) (09/26 0826) Pulse Rate:  [68-85] 71 (09/26 0826) Resp:  [10-29] 18 (09/26 0826) BP: (111-155)/(50-93) 137/86 (09/26 0826) SpO2:  [96 %-100 %] 100 % (09/26 0826)  Intake/Output from previous day:  Intake/Output Summary (Last 24 hours) at 11/26/2022 0830 Last data filed at 11/26/2022 0528 Gross per 24 hour  Intake 2412.99 ml  Output 620 ml  Net 1792.99 ml    Intake/Output this shift: No intake/output data recorded.  Labs: No results for input(s): "HGB" in the last 72 hours. No results for input(s): "WBC", "RBC", "HCT", "PLT" in the last 72 hours. No results for input(s): "NA", "K", "CL", "CO2", "BUN", "CREATININE", "GLUCOSE", "CALCIUM" in the last 72 hours. No results for input(s): "LABPT", "INR" in the last 72 hours.  EXAM: General - Patient is Alert, Appropriate, and Oriented Extremity - Neurologically intact ABD soft Neurovascular intact Sensation intact distally Intact pulses distally Dorsiflexion/Plantar flexion intact No cellulitis present Compartment soft Dressing - dressing C/D/I and no drainage Motor Function - intact, moving foot and toes well on exam.  Patient is able to plantar and dorsiflex with good strength and range of motion.  She is also able to SLR without any assistance.  She is neurovascularly intact down her right lower extremity to all dermatomes.   Posterior tibial pulses appreciated, 2+. JP Drain pulled without difficulty. 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   Weight as of this encounter: 78.5 kg.  Patient has passed her PT protocols and is stable for discharge home.  She will look to transition to home health physical therapy where she will continue to work on ROM, strength and gait training using her right lower extremity.  Discussed with the patient continuing to utilize [employer]   Patient will use bone foam in 20-30 minute intervals   Patient will wear TED hose bilaterally to help prevent DVT and clot formation   Discussed the Aquacel bandage.  This bandage will stay in place 7 days postoperatively.  Can be replaced with honeycomb bandages that will be sent home with the patient   Discussed sending the patient home with tramadol and oxycodone for as needed pain management.  Patient will also be sent home with Celebrex to help with swelling and inflammation.  Patient will take an 81 mg aspirin twice daily for DVT prophylaxis   JP drain removed  without difficulty, intact   Weight-Bearing as tolerated to right leg   Patient will follow-up with [facility] orthopedics in 2 weeks for staple removal and reevaluation  Diet - Regular diet Follow up - in  2 weeks Activity - WBAT Disposition - Home Condition Upon Discharge - Good DVT Prophylaxis - Aspirin and TED hose  Danise Edge, PA-C Orthopaedic Surgery 11/26/2022, 8:30 AM

## 2022-11-26 NOTE — Progress Notes (Signed)
DISCHARGE NOTE:  Pt given discharge instructions. Pt verbalized understanding. TED hose on both legs, walker sent with pts.  Pt wheeled to car by staff, daughter providing transportation.

## 2022-11-26 NOTE — Evaluation (Signed)
Occupational Therapy Evaluation Patient Details Name: Roberta Hanson MRN: [id number] DOB: 08-24-1940 Today's Date: 11/26/2022   History of Present Illness Pt is a 82 y.o. female s/p R TKR (POD 1)   Clinical Impression   Pt seen for OT evaluation this date, POD#1 from above surgery. Pt was seated on EOB upon entry to room, eager to work with OT. Pt was independent in all ADLs prior to surgery, however occasionally using DME/AE for MOBILITY/ADL due to knee pain. Pt is eager to return to PLOF with less pain and improved safety and independence. 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 STS t/f MOD I from EOB. Pt currently requires no physical assistance for UB or LB dressing while in seated position, Pt was treated with supervision for safety during evaluation. Pt amb with MOD I + RW to hall bathroom ~ 83ft with no difficulties. Pt completed standing grooming task at sink, needed one UE to aide in support while brushing teeth. Pt instructed with a handout given in [facility], falls prevention strategies and home/routines modifications. Pt stated she has no concerns returning home. Do not currently anticipate any OT needs following this hospitalization.         If plan is discharge home, recommend the following: A little help with bathing/dressing/bathroom    Functional Status Assessment  Patient has had a recent decline in their functional status and demonstrates the ability to make significant improvements in function in a reasonable and predictable amount of time.  Equipment Recommendations  None recommended by OT    Recommendations for Other Services       Precautions / Restrictions Precautions Precautions: Fall Restrictions Weight Bearing Restrictions: Yes RLE Weight Bearing: Weight bearing as tolerated      Mobility Bed Mobility               General bed mobility comments: Pt sitting on EOB when entering room, STS from  EOB + MOD I with RW    Transfers Overall transfer level: Modified independent Equipment used: Rolling walker (2 wheels) Transfers: Sit to/from Stand Sit to Stand: Modified independent (Device/Increase time)                  Balance Overall balance assessment: Modified Independent Sitting-balance support: Feet supported Sitting balance-Leahy Scale: Normal Sitting balance - Comments: Dynamic sitting balance normal during dressing task   Standing balance support: During functional activity, Single extremity supported (standing grooming task pt supported self on sink while brushing teeth) Standing balance-Leahy Scale: Good                             ADL either performed or assessed with clinical judgement   ADL Overall ADL's : Needs assistance/impaired     Grooming: Wash/dry hands;Oral care;Supervision/safety;Standing Grooming Details (indicate cue type and reason): at sink in bathroom         Upper Body Dressing : Supervision/safety Upper Body Dressing Details (indicate cue type and reason): Pt doff gown + donning her personal clothing with no assistance Lower Body Dressing: Supervision/safety Lower Body Dressing Details (indicate cue type and reason): Don personal clothing             Functional mobility during ADLs: Modified independent;Rolling walker (2 wheels) General ADL Comments: Used RW to amb, needed no physical A     Vision         Perception  Praxis         Pertinent Vitals/Pain Pain Assessment Pain Assessment: No/denies pain     Extremity/Trunk Assessment Upper Extremity Assessment Upper Extremity Assessment: Overall WFL for tasks assessed   Lower Extremity Assessment Lower Extremity Assessment: RLE deficits/detail;Defer to PT evaluation (POD #1 RTK) RLE Deficits / Details: s/p R TKA       Communication Communication Communication: No apparent difficulties Cueing Techniques: Verbal cues   Cognition Arousal:  Alert Behavior During Therapy: WFL for tasks assessed/performed Overall Cognitive Status: Within Functional Limits for tasks assessed                                 General Comments: AO x4; Pleasant and eager to work with OT     General Comments       Exercises Other Exercises Other Exercises: Edu: Role of rehab, safe ADL completion   Shoulder Instructions      Home Living Family/patient expects to be discharged to:: Private residence Living Arrangements: Children Available Help at Discharge: Family;Available PRN/intermittently Type of Home: House Home Access: Stairs to enter [employer] of Steps: 2 Entrance Stairs-Rails: Right;Left;Can reach both Home Layout: One level     Bathroom Shower/Tub: Chief Strategy Officer: Handicapped height     Home Equipment: Agricultural consultant (2 wheels);BSC/3in1;Grab bars - tub/shower;Shower seat   Additional Comments: does not use shower seat      Prior Functioning/Environment Prior Level of Function : Independent/Modified Independent;Driving                        OT Problem List:        OT Treatment/Interventions:      OT Goals(Current goals can be found in the care plan section) Acute Rehab OT Goals Patient Stated Goal: To return home soon OT Goal Formulation: With patient Time For Goal Achievement: 12/03/22 Potential to Achieve Goals: Good  OT Frequency:      Co-evaluation              AM-PAC OT "6 Clicks" Daily Activity     Outcome Measure Help from another person eating meals?: None Help from another person taking care of personal grooming?: A Little Help from another person toileting, which includes using toliet, bedpan, or urinal?: None Help from another person bathing (including washing, rinsing, drying)?: A Little Help from another person to put on and taking off regular upper body clothing?: None Help from another person to put on and taking off regular lower body  clothing?: None 6 Click Score: 22   End of Session Equipment Utilized During Treatment: Rolling walker (2 wheels) Nurse Communication: Mobility status  Activity Tolerance: Patient tolerated treatment well;No increased pain Patient left: in chair;with call bell/phone within reach                   Time: 0850-0908 OT Time Calculation (min): 18 min Charges:  OT General Charges $OT Visit: 1 Visit OT Evaluation $OT Eval Low Complexity: 1 Low  [employer], OTS

## 2022-11-26 NOTE — Plan of Care (Signed)
Problem: Activity: Goal: Ability to avoid complications of mobility impairment will improve Outcome: Progressing Goal: Range of joint motion will improve Outcome: Progressing   Problem: Pain Management: Goal: Pain level will decrease with appropriate interventions Outcome: Progressing

## 2022-11-26 NOTE — Progress Notes (Signed)
Subjective: 1 Day Post-Op Procedure(s) (LRB): COMPUTER ASSISTED TOTAL KNEE ARTHROPLASTY (Right) Patient reports pain as mild.   Patient seen in rounds with Dr. Ernest Pine. Patient is well, and has had no acute complaints or problems.  Denies any CP, SOB, N/V, fevers or chills We will continue with therapy today.  Plan is to go Home after hospital stay.  Objective: Vital signs in last 24 hours: Temp:  [97 F (36.1 C)-97.9 F (36.6 C)] 97.8 F (36.6 C) (09/26 0527) Pulse Rate:  [68-85] 68 (09/26 0527) Resp:  [10-29] 16 (09/26 0527) BP: (111-155)/(50-93) 138/67 (09/26 0527) SpO2:  [96 %-100 %] 100 % (09/26 0527)  Intake/Output from previous day:  Intake/Output Summary (Last 24 hours) at 11/26/2022 0751 Last data filed at 11/26/2022 0528 Gross per 24 hour  Intake 2412.99 ml  Output 620 ml  Net 1792.99 ml    Intake/Output this shift: No intake/output data recorded.  Labs: No results for input(s): "HGB" in the last 72 hours. No results for input(s): "WBC", "RBC", "HCT", "PLT" in the last 72 hours. No results for input(s): "NA", "K", "CL", "CO2", "BUN", "CREATININE", "GLUCOSE", "CALCIUM" in the last 72 hours. No results for input(s): "LABPT", "INR" in the last 72 hours.  EXAM General - Patient is Alert, Appropriate, and Oriented Extremity - Neurologically intact ABD soft Neurovascular intact Sensation intact distally Intact pulses distally Dorsiflexion/Plantar flexion intact No cellulitis present Compartment soft Dressing - dressing C/D/I and no drainage Motor Function - intact, moving foot and toes well on exam.  Patient is able to plantar and dorsiflex with good strength and range of motion.  She is also able to SLR without any assistance.  She is neurovascularly intact down her right lower extremity to all dermatomes.  Posterior tibial pulses appreciated, 2+. JP Drain pulled without difficulty. 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64 kg/m as calculated from the following:   Height as of this encounter: 5\' 2"  (1.575 m).   Weight as of this encounter: 78.5 kg. Advance diet Up with therapy  Patient will continue to work with physical therapy to pass postoperative PT protocols, ROM and strengthening  Discussed with the patient continuing to utilize [facility]  Patient will use bone foam in 20-30 minute intervals  Patient will wear TED hose bilaterally to help prevent DVT and clot formation  Discussed the Aquacel bandage.  This bandage will stay in place 7 days postoperatively.  Can be replaced with honeycomb bandages that will be sent home with the patient  Discussed sending the patient home with tramadol and oxycodone for as needed pain management.  Patient will also be sent home with Celebrex to help with swelling and inflammation.  Patient will take an 81 mg aspirin twice  daily for DVT prophylaxis  JP drain removed without difficulty, intact  Weight-Bearing as tolerated to right leg  Patient will follow-up with [facility] orthopedics in 2 weeks for staple removal and reevaluation  Rayburn Go, PA-C [facility] 11/26/2022, 7:51 AM

## 2023-01-18 ENCOUNTER — Ambulatory Visit (LOCAL_COMMUNITY_HEALTH_CENTER): Payer: Self-pay

## 2023-01-18 DIAGNOSIS — Z111 Encounter for screening for respiratory tuberculosis: Secondary | ICD-10-CM

## 2023-01-21 ENCOUNTER — Ambulatory Visit: Payer: Medicare Other

## 2023-01-21 DIAGNOSIS — Z111 Encounter for screening for respiratory tuberculosis: Secondary | ICD-10-CM

## 2023-01-21 LAB — TB SKIN TEST
Induration: 0 mm
TB Skin Test: NEGATIVE

## 2023-02-28 ENCOUNTER — Ambulatory Visit (INDEPENDENT_AMBULATORY_CARE_PROVIDER_SITE_OTHER): Payer: Medicare Other

## 2023-02-28 ENCOUNTER — Encounter: Payer: Self-pay | Admitting: Emergency Medicine

## 2023-02-28 ENCOUNTER — Ambulatory Visit
Admission: EM | Admit: 2023-02-28 | Discharge: 2023-02-28 | Disposition: A | Discharge status: Home / Self Care | Payer: Medicare Other | Attending: Emergency Medicine | Admitting: Emergency Medicine

## 2023-02-28 DIAGNOSIS — E785 Hyperlipidemia, unspecified: Secondary | ICD-10-CM | POA: Insufficient documentation

## 2023-02-28 DIAGNOSIS — R630 Anorexia: Secondary | ICD-10-CM | POA: Diagnosis not present

## 2023-02-28 DIAGNOSIS — R252 Cramp and spasm: Secondary | ICD-10-CM | POA: Diagnosis not present

## 2023-02-28 DIAGNOSIS — K580 Irritable bowel syndrome with diarrhea: Secondary | ICD-10-CM

## 2023-02-28 DIAGNOSIS — Z6830 Body mass index (BMI) 30.0-30.9, adult: Secondary | ICD-10-CM | POA: Insufficient documentation

## 2023-02-28 DIAGNOSIS — E876 Hypokalemia: Secondary | ICD-10-CM | POA: Diagnosis not present

## 2023-02-28 DIAGNOSIS — R0981 Nasal congestion: Secondary | ICD-10-CM | POA: Diagnosis not present

## 2023-02-28 DIAGNOSIS — K5730 Diverticulosis of large intestine without perforation or abscess without bleeding: Secondary | ICD-10-CM | POA: Diagnosis not present

## 2023-02-28 DIAGNOSIS — I447 Left bundle-branch block, unspecified: Secondary | ICD-10-CM | POA: Diagnosis not present

## 2023-02-28 DIAGNOSIS — D649 Anemia, unspecified: Secondary | ICD-10-CM | POA: Diagnosis not present

## 2023-02-28 DIAGNOSIS — R058 Other specified cough: Secondary | ICD-10-CM | POA: Insufficient documentation

## 2023-02-28 DIAGNOSIS — R140 Abdominal distension (gaseous): Secondary | ICD-10-CM | POA: Insufficient documentation

## 2023-02-28 DIAGNOSIS — I10 Essential (primary) hypertension: Secondary | ICD-10-CM | POA: Diagnosis not present

## 2023-02-28 DIAGNOSIS — R142 Eructation: Secondary | ICD-10-CM | POA: Diagnosis not present

## 2023-02-28 DIAGNOSIS — R197 Diarrhea, unspecified: Secondary | ICD-10-CM | POA: Diagnosis present

## 2023-02-28 DIAGNOSIS — E871 Hypo-osmolality and hyponatremia: Secondary | ICD-10-CM | POA: Diagnosis present

## 2023-02-28 DIAGNOSIS — K219 Gastro-esophageal reflux disease without esophagitis: Secondary | ICD-10-CM | POA: Insufficient documentation

## 2023-02-28 DIAGNOSIS — R6883 Chills (without fever): Secondary | ICD-10-CM | POA: Insufficient documentation

## 2023-02-28 DIAGNOSIS — E669 Obesity, unspecified: Secondary | ICD-10-CM | POA: Diagnosis not present

## 2023-02-28 LAB — CBC WITH DIFFERENTIAL/PLATELET
Abs Immature Granulocytes: 0.05 10*3/uL (ref 0.00–0.07)
Basophils Absolute: 0 10*3/uL (ref 0.0–0.1)
Basophils Relative: 0 %
Eosinophils Absolute: 2.4 10*3/uL — ABNORMAL HIGH (ref 0.0–0.5)
Eosinophils Relative: 22 %
HCT: 37.8 % (ref 36.0–46.0)
Hemoglobin: 11.9 g/dL — ABNORMAL LOW (ref 12.0–15.0)
Immature Granulocytes: 1 %
Lymphocytes Relative: 15 %
Lymphs Abs: 1.6 10*3/uL (ref 0.7–4.0)
MCH: 22.6 pg — ABNORMAL LOW (ref 26.0–34.0)
MCHC: 31.5 g/dL (ref 30.0–36.0)
MCV: 71.9 fL — ABNORMAL LOW (ref 80.0–100.0)
Monocytes Absolute: 0.7 10*3/uL (ref 0.1–1.0)
Monocytes Relative: 6 %
Neutro Abs: 6 10*3/uL (ref 1.7–7.7)
Neutrophils Relative %: 56 %
Platelets: 249 10*3/uL (ref 150–400)
RBC: 5.26 MIL/uL — ABNORMAL HIGH (ref 3.87–5.11)
RDW: 14.7 % (ref 11.5–15.5)
Smear Review: NORMAL
WBC: 10.8 10*3/uL — ABNORMAL HIGH (ref 4.0–10.5)
nRBC: 0 % (ref 0.0–0.2)

## 2023-02-28 LAB — COMPREHENSIVE METABOLIC PANEL
ALT: 10 U/L (ref 0–44)
AST: 21 U/L (ref 15–41)
Albumin: 4.1 g/dL (ref 3.5–5.0)
Alkaline Phosphatase: 53 U/L (ref 38–126)
Anion gap: 10 (ref 5–15)
BUN: 8 mg/dL (ref 8–23)
CO2: 25 mmol/L (ref 22–32)
Calcium: 9 mg/dL (ref 8.9–10.3)
Chloride: 93 mmol/L — ABNORMAL LOW (ref 98–111)
Creatinine, Ser: 0.75 mg/dL (ref 0.44–1.00)
GFR, Estimated: >60 mL/min (ref >60)
Glucose, Bld: 115 mg/dL — ABNORMAL HIGH (ref 70–99)
Potassium: 3.2 mmol/L — ABNORMAL LOW (ref 3.5–5.1)
Sodium: 128 mmol/L — ABNORMAL LOW (ref 135–145)
Total Bilirubin: 1.6 mg/dL — ABNORMAL HIGH (ref <1.2)
Total Protein: 7.1 g/dL (ref 6.5–8.1)

## 2023-02-28 LAB — URINALYSIS, W/ REFLEX TO CULTURE (INFECTION SUSPECTED)
Bacteria, UA: NONE SEEN
Bilirubin Urine: NEGATIVE
Glucose, UA: NEGATIVE mg/dL
Leukocytes,Ua: NEGATIVE
Nitrite: NEGATIVE
Protein, ur: NEGATIVE mg/dL
Specific Gravity, Urine: <1.005 — ABNORMAL LOW (ref 1.005–1.030)
Squamous Epithelial / HPF: NONE SEEN /[HPF] (ref 0–5)
WBC, UA: NONE SEEN WBC/hpf (ref 0–5)
pH: 6 (ref 5.0–8.0)

## 2023-02-28 LAB — RESP PANEL BY RT-PCR (RSV, FLU A&B, COVID)  RVPGX2
Influenza A by PCR: NEGATIVE
Influenza B by PCR: NEGATIVE
Resp Syncytial Virus by PCR: NEGATIVE
SARS Coronavirus 2 by RT PCR: NEGATIVE

## 2023-02-28 MED ORDER — ONDANSETRON 8 MG PO TBDP: 8.0000 mg | ORAL_TABLET | Freq: Three times a day (TID) | ORAL | 0 refills | Status: AC | PRN

## 2023-02-28 MED ORDER — POTASSIUM CHLORIDE CRYS ER 20 MEQ PO TBCR
20.0000 meq | EXTENDED_RELEASE_TABLET | Freq: Two times a day (BID) | ORAL | 0 refills | Status: AC
Start: 2023-02-28 — End: 2023-03-03

## 2023-02-28 NOTE — ED Provider Notes (Signed)
[facility]    CSN: [id number] Arrival date & time: 02/28/23  1011      History   Chief Complaint Chief Complaint  Patient presents with   Diarrhea    HPI Roberta Hanson is a 82 y.o. female.   HPI  82 year old female with past medical history significant for hypertension, diverticulitis, hyperlipidemia, left bundle branch block, anemia,, GERD, IBS, and obesity presents for evaluation of abdominal bloating and diarrhea that started 2 days ago.  She reports that she is having 5-6 diarrhea stools a day but denies any blood in her stool.  She is having some upper abdominal cramping but no distinct pain.  Also no nausea or vomiting.  She has not checked a fever at home but has reported chills.  She is experiencing some belching.  She also has nasal congestion only in the mornings and a nonproductive cough that only occurs at bedtime.  She denies any fever, ear pain, or sore throat.  She is able to drink fluids but reports a decreased appetite.  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   Colon polyp    Degenerative disc disease, cervical 12/2013   Diverticulitis    Diverticulosis    Eosinophilia    Food intolerance    GERD (gastroesophageal reflux disease) 01/01/2014   High risk medication use    Hyperlipidemia, mixed    Hypertension, essential, benign    Irritable bowel syndrome    Mixed hyperlipidemia    Obesity (BMI 35.0-39.9 without comorbidity)    Osteoarthritis 01/01/2014   Primary osteoarthritis of right knee    Seasonal allergies 01/01/2014    Patient Active Problem List   Diagnosis Date Noted   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   Diuretic-induced hypokalemia 01/11/2018   History of revision of total replacement of left hip joint 08/30/2017   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   Primary osteoarthritis of right knee 12/23/2016   Obesity (BMI 30.0-34.9) 12/23/2016   High risk  medication use 01/08/2016   Bundle branch block, 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   Irritable bowel syndrome 07/04/2015   Tinnitus, bilateral 01/02/2015   Benign essential hypertension 01/01/2014   Degenerative disc disease, cervical 01/01/2014   Eosinophil count raised 01/01/2014   GERD (gastroesophageal reflux disease) 01/01/2014   Mixed hyperlipidemia 01/01/2014    Past Surgical History:  Procedure Laterality Date   BILATERAL CARPAL TUNNEL RELEASE     right 06/04/2006 and left 08/15/2007   COLONOSCOPY     2004, 2010, 2015   COLONOSCOPY WITH PROPOFOL N/A 08/24/2018   Procedure: COLONOSCOPY WITH PROPOFOL;  Surgeon: Toledo, Boykin Nearing, MD;  Location: [facility];  Service: Gastroenterology;  Laterality: N/A;   ESOPHAGOGASTRODUODENOSCOPY  05/23/2007   HARDWARE REMOVAL Left 08/08/1974   removal of plate, left radius, bone graft, left ulna from left ilium   HEMICOLECTOMY Left 1992   due to diverticulitis   JOINT REPLACEMENT     KNEE ARTHROPLASTY Right 11/25/2022   Procedure: COMPUTER ASSISTED TOTAL KNEE ARTHROPLASTY;  Surgeon: Donato Heinz, MD;  Location: [facility];  Service: Orthopedics;  Laterality: Right;   ORIF DISTAL RADIUS FRACTURE Left 08/31/1973   compression plate and small bone plate   POLYPECTOMY     REVISION TOTAL HIP ARTHROPLASTY Left 1997   TOTAL HIP ARTHROPLASTY Left 1992   TOTAL HIP ARTHROPLASTY Right 2003   TOTAL HIP REVISION Left 07/14/2017   Procedure: TOTAL HIP REVISION;  Surgeon: Ernest Pine,  Illene Labrador, MD;  Location: [facility];  Service: Orthopedics;  Laterality: Left;   UPPER GI ENDOSCOPY  2009   stretched esophagus    OB History   No obstetric history on file.      Home Medications    Prior to Admission medications   Medication Sig Start Date End Date Taking? Authorizing Provider  ondansetron (ZOFRAN-ODT) 8 MG disintegrating tablet Take 1 tablet (8 mg total) by mouth every 8 (eight) hours as needed for nausea or vomiting. 02/28/23  Yes Becky Augusta, NP  potassium  chloride SA (KLOR-CON M) 20 MEQ tablet Take 1 tablet (20 mEq total) by mouth 2 (two) times daily for 3 days. 02/28/23 03/03/23 Yes Becky Augusta, NP  acetaminophen (TYLENOL) 650 MG CR tablet Take 650 mg by mouth every 8 (eight) hours as needed for pain.    [provider]  aspirin EC 81 MG tablet Take 1 tablet (81 mg total) by mouth in the morning and at bedtime. Swallow whole. 11/26/22 11/26/23  Rayburn Go, PA-C  celecoxib (CELEBREX) 200 MG capsule Take 1 capsule (200 mg total) by mouth 2 (two) times daily. 11/26/22   Rayburn Go, PA-C  clidinium-chlordiazePOXIDE (LIBRAX) 5-2.5 MG capsule Take 1 capsule by mouth 3 (three) times daily as needed.    [provider]  fexofenadine (ALLEGRA) 180 MG tablet Take 180 mg by mouth daily as needed.    [provider]  Flaxseed, Linseed, (FLAXSEED OIL) 1200 MG CAPS Take 1 capsule by mouth daily.    [provider]  fluticasone (FLONASE) 50 MCG/ACT nasal spray Place 1 spray into both nostrils as needed for allergies or rhinitis.    [provider]  Garlic (GARLIQUE PO) Take 1 capsule by mouth daily.    [provider]  levocetirizine (XYZAL) 5 MG tablet Take 5 mg by mouth at bedtime as needed for allergies.    [provider]  losartan-hydrochlorothiazide (HYZAAR) 50-12.5 MG tablet Take 1 tablet by mouth daily. 01/12/18 11/25/22  [provider]  MAGNESIUM GLYCINATE PO Take 2 capsules by mouth daily as needed.    [provider]  Misc Natural Products (BEET ROOT PO) Take 1 tablet by mouth 2 (two) times daily.    [provider]  montelukast (SINGULAIR) 10 MG tablet Take 10 mg by mouth daily as needed.    [provider]  Multiple Vitamins-Minerals (EMERGEN-C VITAMIN C) PACK Take 1 Package by mouth daily as needed.    [provider]  Multiple Vitamins-Minerals (EQ COMPLETE MULTIVITAMIN-ADULT PO) Take 1 tablet by mouth 2 (two) times daily.    [provider]  Naphazoline-Glycerin (CLEAR EYES MAX REDNESS RELIEF OP) Apply 1 drop to eye as needed.    [provider]  oxyCODONE (OXY IR/ROXICODONE) 5 MG immediate release tablet Take 1 tablet (5 mg total) by mouth every 4 (four) hours as needed for moderate pain (pain score 4-6). 11/26/22   Rayburn Go, PA-C  pantoprazole (PROTONIX) 40 MG tablet Take 40 mg by mouth daily. 01/08/17   [provider]  Peppermint Oil 90 MG CPCR Take 2 capsules by mouth daily as needed.    [provider]  potassium chloride (KLOR-CON) 10 MEQ tablet Take 10 mEq by mouth 2 (two) times daily. 11/03/22   [provider]  pravastatin (PRAVACHOL) 40 MG tablet Take 40 mg by mouth at bedtime. 01/13/18 11/25/22  [provider]  Probiotic Product (ALIGN PO) Take 1 capsule by mouth daily.    [provider]  traMADol (ULTRAM) 50 MG tablet Take 1-2 tablets (50-100 mg total) by mouth every 4 (four) hours as needed for moderate pain. 11/26/22   Rayburn Go, PA-C    Family History Family History  Problem Relation Age of Onset   Hypertension Mother    Alzheimer's disease Mother    Heart attack Mother    Diabetes Father    Hypertension Father    Stroke Father    Breast cancer Cousin 40       mat cousin   Breast cancer Other 30   Lung cancer Brother    Cancer Brother    Thyroid disease Sister    Hypertension Sister    Colonic polyp Sister    Colonic polyp Brother    Diverticulitis Brother     Social History Social History   Tobacco Use   Smoking status: Never   Smokeless tobacco: Never  Vaping Use   Vaping status: Never Used  Substance Use Topics   Alcohol use: Yes    Comment: rarely wine   Drug use: Never     Allergies   Sulfa antibiotics, Lisinopril, Citalopram, and Metronidazole   Review of Systems Review of Systems  Constitutional:  Positive for appetite change and chills. Negative for fever.  HENT:  Positive for congestion.    Respiratory:  Positive for cough. Negative for shortness of breath and wheezing.   Gastrointestinal:  Positive for abdominal distention, abdominal pain and diarrhea. Negative for blood in stool, nausea and vomiting.       Belching  Genitourinary:  Positive for decreased urine volume. Negative for dysuria, frequency and urgency.     Physical Exam Triage Vital Signs ED Triage Vitals  Encounter Vitals Group     BP      Systolic BP Percentile      Diastolic BP Percentile      Pulse      Resp      Temp      Temp src      SpO2      Weight      Height      Head Circumference      Peak Flow      Pain Score      Pain Loc      Pain Education      Exclude from Growth Chart    No data found.  Updated Vital Signs BP (!) 144/81 (BP Location: Right Arm)   Pulse 95   Temp 98.4 F (36.9 C) (Oral)   Resp 14   Ht 5\' 3"  (1.6 m)   Wt 173 lb 1 oz (78.5 kg)   SpO2 96%   BMI 30.66 kg/m   Visual Acuity Right Eye Distance:   Left Eye Distance:   Bilateral Distance:    Right Eye Near:   Left Eye Near:    Bilateral Near:     Physical Exam Vitals and nursing note reviewed.  Constitutional:      Appearance: Normal appearance. She is not ill-appearing.  HENT:     Head: Normocephalic and atraumatic.     Right Ear: Tympanic membrane, ear canal and external ear normal. There is no impacted cerumen.     Left Ear: Tympanic membrane, ear canal and external ear normal. There is no impacted cerumen.     Nose: Nose normal. No congestion or rhinorrhea.     Mouth/Throat:     Mouth: Mucous membranes are moist.     Pharynx: Oropharynx is clear.  No oropharyngeal exudate or posterior oropharyngeal erythema.  Cardiovascular:     Rate and Rhythm: Normal rate and regular rhythm.     Pulses: Normal pulses.     Heart sounds: Normal heart sounds. No murmur heard.    No friction rub. No gallop.  Pulmonary:     Effort: Pulmonary effort is normal.     Breath sounds: Normal breath sounds. No  wheezing, rhonchi or rales.  Abdominal:     General: There is distension.     Palpations: Abdomen is soft.     Tenderness: There is abdominal tenderness. There is no guarding or rebound.     Comments: Upper abdominal distention with mild epigastric tenderness.  No focal findings, guarding, rebound.  Lower abdomen is benign.  Musculoskeletal:     Cervical back: Normal range of motion and neck supple. No tenderness.  Lymphadenopathy:     Cervical: No cervical adenopathy.  Skin:    General: Skin is warm and dry.     Capillary Refill: Capillary refill takes less than 2 seconds.     Findings: No rash.     Comments: Normal skin turgor.  Neurological:     General: No focal deficit present.     Mental Status: She is alert and oriented to person, place, and time.      UC Treatments / Results  Labs (all labs ordered are listed, but only abnormal results are displayed) Labs Reviewed  CBC WITH DIFFERENTIAL/PLATELET - Abnormal; Notable for the following components:      Result Value   WBC 10.8 (*)    RBC 5.26 (*)    Hemoglobin 11.9 (*)    MCV 71.9 (*)    MCH 22.6 (*)    All other components within normal limits  COMPREHENSIVE METABOLIC PANEL - Abnormal; Notable for the following components:   Sodium 128 (*)    Potassium 3.2 (*)    Chloride 93 (*)    Glucose, Bld 115 (*)    Total Bilirubin 1.6 (*)    All other components within normal limits  RESP PANEL BY RT-PCR (RSV, FLU A&B, COVID)  RVPGX2  URINALYSIS, W/ REFLEX TO CULTURE (INFECTION SUSPECTED)    EKG   Radiology DG Abd 2 Views Result Date: 02/28/2023 CLINICAL DATA:  Abdominal pain and bloating.  Diarrhea. EXAM: ABDOMEN - 2 VIEW COMPARISON:  None Available. FINDINGS: The bowel gas pattern is normal. There is no evidence of free air. A focal cluster of calcifications is seen in the medial right upper quadrant, which may be pancreatic in origin. Other scattered tiny radiodensities within the abdomen and pelvis likely represent  ingested radiopaque substance. Bilateral hip prostheses and advanced lumbar spine degenerative changes noted. IMPRESSION: Normal bowel gas pattern. Focal cluster of calcifications in medial right upper quadrant, which may be pancreatic in origin. This could represent chronic pancreatitis or calcified pancreatic mass. Recommend abdomen CT without and with contrast for further evaluation. Electronically Signed   By: Danae Orleans M.D.   On: 02/28/2023 12:13    Procedures Procedures (including critical care time)  Medications Ordered in UC Medications - No data to display  Initial Impression / Assessment and Plan / UC Course  I have reviewed the triage vital signs and the nursing notes.  Pertinent labs & imaging results that were available during my care of the patient were reviewed by me and considered in my medical decision making (see chart for details).   Patient is a nontoxic-appearing 82 year old female presenting for evaluation of  abdominal bloating with upper abdominal cramping and associated diarrhea but no nausea or vomiting that started 2 days ago.  She does have a history of IBS and GERD and reports that her symptoms feel similar to exacerbations of both.  She is currently prescribed pantoprazole for treatment of her GERD and reports that she is taking that medication and has not missed any doses.  She is having 5-6 diarrhea stools a day and she has not seen any blood.  She reports that she is unsure if she is urinating with each diarrhea stool.  She has not had any stand-alone urination over the past 2 days.  She also Dors is some nasal congestion in the morning and a slight, nonproductive cough that only occurs when she lays down at night.  Physical exam reveals a benign upper respiratory tract.  Cardiopulmonary exam reveals S1-S2 heart sounds with regular rate and rhythm and lungs that are clear to auscultation all fields.  Patient's ocular sclera are bright and shiny and her oral mucous  membranes are moist.  Abdomen does reveal some upper abdominal distention and mild epigastric tenderness.  Her lower abdomen is benign.  Suspect this is most likely an IBS flare.  Given the degree of diarrhea she is not experiencing I will order a CBC and CMP to evaluate for the presence of any systemic infection or electrolyte abnormality.  I will also order urinalysis to evaluate for the patient's hydration status.  A respiratory panel for COVID, flu, and RSV was collected at triage and is pending.  CBC shows a mildly elevated white count of 10.8.  H&H shows a mildly decreased hemoglobin of 11.9 but hematocrit is normal at 37.8.  MCV and MCH are both mildly decreased at 71.9 22.6 respectively.  CMP shows mild hyponatremia with a sodium of 128, mild hypokalemia with potassium 3.2, chloride is mildly low at 93.  BUN and creatinine and transaminases are unremarkable.  Respiratory panel is negative for COVID, influenza, and RSV.  2 view abdomen shows nonobstructive bowel gas pattern.  No significant stool burden noted.  Radiology overread is pending. Radiology impression states there is a focal cluster of calcifications in the medial right upper quadrant which may be pancreatic in origin.  Possibly representing chronic pancreatitis or calcified pancreatic mass.  CT of the abdomen pelvis with and without contrast recommended for further evaluation.  I will discharge patient home with a diagnosis of diarrhea, hypokalemia, and hyponatremia with a prescription for Zofran to help with nausea.  I will encourage oral fluid rehydration using Gatorade, liquid IV, and water.  I will also order potassium supplementation 20 mEq's twice daily for 3 days to replenish the potassium she is lost of her diarrhea stool.  Dietary food guide also provided to help patient with food choices to alleviate her diarrhea.  She should make an appointment to follow-up with her primary care provider later this week to have repeat lab  work as well as to discuss CT scan of the abdomen pelvis to evaluate the calcification seen in her abdomen.  If she develops any increase in her abdominal pain, nausea or vomiting where she cannot keep down fluids, or increase in diarrhea stools with or without the presence of blood she should go to the ER for evaluation.   Final Clinical Impressions(s) / UC Diagnoses   Final diagnoses:  Irritable bowel syndrome with diarrhea  Diarrhea in adult patient  Hyponatremia  Hypokalemia     Discharge Instructions  Your lab work showed that you have of a low sodium and also a mildly low potassium.  These are most likely related to your lack of food intake and your diarrhea.  I am going to prescribe potassium supplementation and I want you to take 20 mEq of potassium twice daily for the next 3 days.  Hold your standard potassium dosing until after you have completed this supplementation.  I want you to rehydrate orally with Gatorade, liquid IV, or water with a small amount of sea salt to help replace the sodium that you have lost.  I have included a list of foods that can help you with your diarrhea as well.  Continue your pantoprazole for treatment of your reflux disease as previously prescribed.  I have prescribed Zofran that you can use every 8 hours as needed for nausea or vomiting.  Your x-ray showed a calcification in your upper abdomen that may represent an abnormality with your pancreas and they are recommending a CT scan.  Make a follow-up appointment for this coming week with your primary care provider to discuss the need for a CT scan of your abdomen given the x-ray findings as well as to have your labs redrawn to check for normalization of your sodium and potassium.  If you develop any fever, severe sharp abdominal pain, have an increase in diarrhea stools or blood in your stool, or develop nausea and vomiting where you cannot keep fluids down you need to go to the ER for  evaluation.     ED Prescriptions     Medication Sig Dispense Auth. Provider   potassium chloride SA (KLOR-CON M) 20 MEQ tablet Take 1 tablet (20 mEq total) by mouth 2 (two) times daily for 3 days. 6 tablet Becky Augusta, NP   ondansetron (ZOFRAN-ODT) 8 MG disintegrating tablet Take 1 tablet (8 mg total) by mouth every 8 (eight) hours as needed for nausea or vomiting. 20 tablet Becky Augusta, NP      PDMP not reviewed this encounter.   Becky Augusta, NP 02/28/23 [id number]

## 2023-02-28 NOTE — ED Triage Notes (Signed)
Patient reports bloating and diarrhea that started on Friday.  Patient denies N/V.  Patient denies fevers.

## 2023-02-28 NOTE — Discharge Instructions (Addendum)
Your lab work showed that you have of a low sodium and also a mildly low potassium.  These are most likely related to your lack of food intake and your diarrhea.  I am going to prescribe potassium supplementation and I want you to take 20 mEq of potassium twice daily for the next 3 days.  Hold your standard potassium dosing until after you have completed this supplementation.  I want you to rehydrate orally with Gatorade, liquid IV, or water with a small amount of sea salt to help replace the sodium that you have lost.  I have included a list of foods that can help you with your diarrhea as well.  Continue your pantoprazole for treatment of your reflux disease as previously prescribed.  I have prescribed Zofran that you can use every 8 hours as needed for nausea or vomiting.  Your x-ray showed a calcification in your upper abdomen that may represent an abnormality with your pancreas and they are recommending a CT scan.  Make a follow-up appointment for this coming week with your primary care provider to discuss the need for a CT scan of your abdomen given the x-ray findings as well as to have your labs redrawn to check for normalization of your sodium and potassium.  If you develop any fever, severe sharp abdominal pain, have an increase in diarrhea stools or blood in your stool, or develop nausea and vomiting where you cannot keep fluids down you need to go to the ER for evaluation.

## 2023-03-01 LAB — PATHOLOGIST SMEAR REVIEW

## 2023-03-11 ENCOUNTER — Other Ambulatory Visit: Payer: Self-pay | Admitting: Family Medicine

## 2023-03-11 DIAGNOSIS — K589 Irritable bowel syndrome without diarrhea: Secondary | ICD-10-CM

## 2023-03-11 DIAGNOSIS — R1902 Left upper quadrant abdominal swelling, mass and lump: Secondary | ICD-10-CM

## 2023-03-17 ENCOUNTER — Ambulatory Visit
Admission: RE | Admit: 2023-03-17 | Discharge: 2023-03-17 | Disposition: A | Discharge status: Home / Self Care | Payer: No Typology Code available for payment source | Source: Ambulatory Visit | Attending: Family Medicine | Admitting: Family Medicine

## 2023-03-17 DIAGNOSIS — K219 Gastro-esophageal reflux disease without esophagitis: Secondary | ICD-10-CM | POA: Diagnosis not present

## 2023-03-17 DIAGNOSIS — R1900 Intra-abdominal and pelvic swelling, mass and lump, unspecified site: Secondary | ICD-10-CM | POA: Diagnosis not present

## 2023-03-17 DIAGNOSIS — R1902 Left upper quadrant abdominal swelling, mass and lump: Secondary | ICD-10-CM | POA: Diagnosis not present

## 2023-03-17 DIAGNOSIS — K589 Irritable bowel syndrome without diarrhea: Secondary | ICD-10-CM | POA: Diagnosis not present

## 2023-03-17 DIAGNOSIS — K5710 Diverticulosis of small intestine without perforation or abscess without bleeding: Secondary | ICD-10-CM | POA: Diagnosis not present

## 2023-03-17 MED ORDER — IOHEXOL 300 MG/ML  SOLN
100.0000 mL | Freq: Once | INTRAMUSCULAR | Status: AC | PRN
  Administered 2023-03-17: 100 mL via INTRAVENOUS

## 2023-04-09 DIAGNOSIS — E785 Hyperlipidemia, unspecified: Secondary | ICD-10-CM | POA: Diagnosis not present

## 2023-04-09 DIAGNOSIS — Z6830 Body mass index (BMI) 30.0-30.9, adult: Secondary | ICD-10-CM | POA: Diagnosis not present

## 2023-04-09 DIAGNOSIS — Z008 Encounter for other general examination: Secondary | ICD-10-CM | POA: Diagnosis not present

## 2023-04-09 DIAGNOSIS — E669 Obesity, unspecified: Secondary | ICD-10-CM | POA: Diagnosis not present

## 2023-04-09 DIAGNOSIS — I10 Essential (primary) hypertension: Secondary | ICD-10-CM | POA: Diagnosis not present

## 2023-04-09 DIAGNOSIS — K589 Irritable bowel syndrome without diarrhea: Secondary | ICD-10-CM | POA: Diagnosis not present

## 2023-06-01 DIAGNOSIS — F419 Anxiety disorder, unspecified: Secondary | ICD-10-CM | POA: Diagnosis not present

## 2023-06-01 DIAGNOSIS — E782 Mixed hyperlipidemia: Secondary | ICD-10-CM | POA: Diagnosis not present

## 2023-06-01 DIAGNOSIS — K589 Irritable bowel syndrome without diarrhea: Secondary | ICD-10-CM | POA: Diagnosis not present

## 2023-06-01 DIAGNOSIS — J301 Allergic rhinitis due to pollen: Secondary | ICD-10-CM | POA: Diagnosis not present

## 2023-06-01 DIAGNOSIS — I10 Essential (primary) hypertension: Secondary | ICD-10-CM | POA: Diagnosis not present

## 2023-06-01 DIAGNOSIS — K219 Gastro-esophageal reflux disease without esophagitis: Secondary | ICD-10-CM | POA: Diagnosis not present

## 2023-06-28 DIAGNOSIS — M65341 Trigger finger, right ring finger: Secondary | ICD-10-CM | POA: Diagnosis not present

## 2023-10-11 DIAGNOSIS — M50321 Other cervical disc degeneration at C4-C5 level: Secondary | ICD-10-CM | POA: Diagnosis not present

## 2023-10-11 DIAGNOSIS — M50322 Other cervical disc degeneration at C5-C6 level: Secondary | ICD-10-CM | POA: Diagnosis not present

## 2023-10-11 DIAGNOSIS — M50323 Other cervical disc degeneration at C6-C7 level: Secondary | ICD-10-CM | POA: Diagnosis not present

## 2023-10-11 DIAGNOSIS — M25512 Pain in left shoulder: Secondary | ICD-10-CM | POA: Diagnosis not present

## 2023-10-11 DIAGNOSIS — M19012 Primary osteoarthritis, left shoulder: Secondary | ICD-10-CM | POA: Diagnosis not present

## 2023-11-02 DIAGNOSIS — Z96651 Presence of right artificial knee joint: Secondary | ICD-10-CM | POA: Diagnosis not present

## 2023-12-08 DIAGNOSIS — Z0000 Encounter for general adult medical examination without abnormal findings: Secondary | ICD-10-CM | POA: Diagnosis not present

## 2023-12-08 DIAGNOSIS — E782 Mixed hyperlipidemia: Secondary | ICD-10-CM | POA: Diagnosis not present

## 2023-12-08 DIAGNOSIS — K589 Irritable bowel syndrome without diarrhea: Secondary | ICD-10-CM | POA: Diagnosis not present

## 2023-12-08 DIAGNOSIS — I10 Essential (primary) hypertension: Secondary | ICD-10-CM | POA: Diagnosis not present

## 2023-12-08 DIAGNOSIS — K219 Gastro-esophageal reflux disease without esophagitis: Secondary | ICD-10-CM | POA: Diagnosis not present

## 2023-12-08 DIAGNOSIS — J301 Allergic rhinitis due to pollen: Secondary | ICD-10-CM | POA: Diagnosis not present

## 2023-12-08 DIAGNOSIS — F419 Anxiety disorder, unspecified: Secondary | ICD-10-CM | POA: Diagnosis not present

## 2023-12-20 DIAGNOSIS — Z6831 Body mass index (BMI) 31.0-31.9, adult: Secondary | ICD-10-CM | POA: Diagnosis not present

## 2023-12-20 DIAGNOSIS — I10 Essential (primary) hypertension: Secondary | ICD-10-CM | POA: Diagnosis not present

## 2023-12-20 DIAGNOSIS — Z008 Encounter for other general examination: Secondary | ICD-10-CM | POA: Diagnosis not present

## 2023-12-20 DIAGNOSIS — E669 Obesity, unspecified: Secondary | ICD-10-CM | POA: Diagnosis not present
